# Patient Record
Sex: Female | Born: 1995 | Race: Black or African American | Hispanic: No | Marital: Married | Smoking: Never smoker
Health system: Southern US, Community
[De-identification: ages and names within clinical notes are randomized; demographics above are authoritative.]

## PROBLEM LIST (undated history)

## (undated) ENCOUNTER — Ambulatory Visit (HOSPITAL_COMMUNITY): Discharge status: Absent without leave | Payer: Medicaid Other

## (undated) DIAGNOSIS — D649 Anemia, unspecified: Secondary | ICD-10-CM

## (undated) DIAGNOSIS — S066XAA Traumatic subarachnoid hemorrhage with loss of consciousness status unknown, initial encounter: Secondary | ICD-10-CM

## (undated) DIAGNOSIS — S069XAA Unspecified intracranial injury with loss of consciousness status unknown, initial encounter: Secondary | ICD-10-CM

## (undated) DIAGNOSIS — R569 Unspecified convulsions: Secondary | ICD-10-CM

## (undated) DIAGNOSIS — F79 Unspecified intellectual disabilities: Secondary | ICD-10-CM

## (undated) HISTORY — DX: Unspecified intracranial injury with loss of consciousness status unknown, initial encounter: S06.9XAA

## (undated) HISTORY — DX: Traumatic subarachnoid hemorrhage with loss of consciousness status unknown, initial encounter: S06.6XAA

## (undated) HISTORY — DX: Anemia, unspecified: D64.9

## (undated) HISTORY — DX: Unspecified convulsions: R56.9

## (undated) HISTORY — DX: Unspecified intellectual disabilities: F79

## (undated) HISTORY — PX: NO PAST SURGERIES: SHX2092

---
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
## 2017-03-10 ENCOUNTER — Emergency Department (HOSPITAL_COMMUNITY)
Admission: EM | Admit: 2017-03-10 | Discharge: 2017-03-10 | Disposition: A | Discharge status: Home / Self Care | Payer: Medicaid Other | Attending: Emergency Medicine | Admitting: Emergency Medicine

## 2017-03-10 ENCOUNTER — Encounter (HOSPITAL_COMMUNITY): Payer: Self-pay | Admitting: Emergency Medicine

## 2017-03-10 DIAGNOSIS — S0100XA Unspecified open wound of scalp, initial encounter: Secondary | ICD-10-CM | POA: Diagnosis not present

## 2017-03-10 DIAGNOSIS — Y939 Activity, unspecified: Secondary | ICD-10-CM | POA: Diagnosis not present

## 2017-03-10 DIAGNOSIS — X58XXXA Exposure to other specified factors, initial encounter: Secondary | ICD-10-CM | POA: Insufficient documentation

## 2017-03-10 DIAGNOSIS — Y929 Unspecified place or not applicable: Secondary | ICD-10-CM | POA: Diagnosis not present

## 2017-03-10 DIAGNOSIS — Z79899 Other long term (current) drug therapy: Secondary | ICD-10-CM | POA: Diagnosis not present

## 2017-03-10 DIAGNOSIS — Y999 Unspecified external cause status: Secondary | ICD-10-CM | POA: Insufficient documentation

## 2017-03-10 MED ORDER — TRAMADOL HCL 50 MG PO TABS: 50.0000 mg | ORAL_TABLET | Freq: Four times a day (QID) | ORAL | 0 refills | Status: DC | PRN

## 2017-03-10 MED ORDER — CEPHALEXIN 500 MG PO CAPS: 500.0000 mg | ORAL_CAPSULE | Freq: Two times a day (BID) | ORAL | 0 refills | Status: DC

## 2017-03-10 NOTE — ED Triage Notes (Signed)
Patient here with complaints of head wound infection. Reports that she went to get her hair braided and braids were too tight. Pain 10/10. Speaks no [place], language is Swahili.

## 2017-03-10 NOTE — Discharge Instructions (Signed)
Clean with soap and water Take antibiotic for the next 5 days Follow up with primary care doctor

## 2017-03-10 NOTE — ED Provider Notes (Signed)
[facility]-EMERGENCY DEPT Provider Note   CSN: [id number] Arrival date & time: 03/10/17  1519     History   Chief Complaint Chief Complaint  Patient presents with  . Wound Infection    HPI Nancy [name] is a 21 y.o. female who presents with a scalp wound. No significant PMH. She states that she initially noticed the wound on the top of her scalp 3 weeks ago and it was small. She had her hair braided afterwards which made the wound worse. She has been keeping it clean and taking OTC pain medicine with minimal relief. She denies fever or drainage. She does not have a PCP.  HPI  Past Medical History:  Diagnosis Date  . Hearing loss     There are no active problems to display for this patient.   History reviewed. No pertinent surgical history.  OB History    No data available       Home Medications    Prior to Admission medications   Medication Sig Start Date End Date Taking? Authorizing Provider  ibuprofen (ADVIL,MOTRIN) 600 MG tablet Take 1 tablet (600 mg total) by mouth 3 (three) times daily. 07/13/16   Danelle Berry, PA-C  traMADol (ULTRAM) 50 MG tablet Take 1 tablet (50 mg total) by mouth every 12 (twelve) hours as needed for severe pain. 07/13/16   Danelle Berry, PA-C    Family History No family history on file.  Social History Social History  Substance Use Topics  . Smoking status: Never Smoker  . Smokeless tobacco: Never Used  . Alcohol use No     Allergies   Patient has no known allergies.   Review of Systems Review of Systems  Constitutional: Negative for chills and fever.  Skin: Positive for wound.  All other systems reviewed and are negative.    Physical Exam Updated Vital Signs BP 121/83 (BP Location: Right Arm)   Pulse 89   Temp 97.5 F (36.4 C) (Oral)   Resp 18   SpO2 100%   Physical Exam  Constitutional: She is oriented to person, place, and time. She appears well-developed and well-nourished. No distress.  HENT:  Head: Normocephalic  and atraumatic.  Eyes: Conjunctivae are normal. Pupils are equal, round, and reactive to light. Right eye exhibits no discharge. Left eye exhibits no discharge. No scleral icterus.  Neck: Normal range of motion.  Cardiovascular: Normal rate.   Pulmonary/Chest: Effort normal. No respiratory distress.  Abdominal: She exhibits no distension.  Neurological: She is alert and oriented to person, place, and time.  Wound noted on top of scalp which does not appear acutely infected. No redness or drainage noted. No tenderness  Skin: Skin is warm and dry.  Psychiatric: She has a normal mood and affect. Her behavior is normal.  Nursing note and vitals reviewed.      ED Treatments / Results  Labs (all labs ordered are listed, but only abnormal results are displayed) Labs Reviewed - No data to display  EKG  EKG Interpretation None       Radiology No results found.  Procedures Procedures (including critical care time)  Medications Ordered in ED Medications - No data to display   Initial Impression / Assessment and Plan / ED Course  I have reviewed the triage vital signs and the nursing notes.  Pertinent labs & imaging results that were available during my care of the patient were reviewed by me and considered in my medical decision making (see chart for details).  21 year  old female presents with scalp wound. Vitals are normal. Wet-to-dry dressing placed and pt will be given Keflex and Tramadol. Advised follow up with PCP. Return precautions given.  Final Clinical Impressions(s) / ED Diagnoses   Final diagnoses:  Open wound of scalp, unspecified open wound type, initial encounter    New Prescriptions New Prescriptions   No medications on file     Bethel Born, PA-C 03/10/17 1627    Lyndal Pulley, MD 03/11/17 [phone]

## 2017-03-30 ENCOUNTER — Encounter (HOSPITAL_COMMUNITY): Payer: Self-pay | Admitting: Emergency Medicine

## 2017-03-30 ENCOUNTER — Emergency Department (HOSPITAL_COMMUNITY): Payer: Medicaid Other

## 2017-03-30 ENCOUNTER — Other Ambulatory Visit: Payer: Self-pay

## 2017-03-30 ENCOUNTER — Observation Stay (HOSPITAL_COMMUNITY)
Admission: EM | Admit: 2017-03-30 | Discharge: 2017-03-31 | Disposition: A | Discharge status: Home / Self Care | Payer: Medicaid Other | Attending: Family Medicine | Admitting: Family Medicine

## 2017-03-30 DIAGNOSIS — D509 Iron deficiency anemia, unspecified: Secondary | ICD-10-CM | POA: Diagnosis not present

## 2017-03-30 DIAGNOSIS — B350 Tinea barbae and tinea capitis: Secondary | ICD-10-CM | POA: Diagnosis not present

## 2017-03-30 DIAGNOSIS — O98811 Other maternal infectious and parasitic diseases complicating pregnancy, first trimester: Secondary | ICD-10-CM | POA: Diagnosis not present

## 2017-03-30 DIAGNOSIS — Z3490 Encounter for supervision of normal pregnancy, unspecified, unspecified trimester: Secondary | ICD-10-CM

## 2017-03-30 DIAGNOSIS — Z3A11 11 weeks gestation of pregnancy: Secondary | ICD-10-CM | POA: Diagnosis not present

## 2017-03-30 DIAGNOSIS — R55 Syncope and collapse: Secondary | ICD-10-CM

## 2017-03-30 DIAGNOSIS — D649 Anemia, unspecified: Secondary | ICD-10-CM | POA: Diagnosis present

## 2017-03-30 DIAGNOSIS — Z3491 Encounter for supervision of normal pregnancy, unspecified, first trimester: Secondary | ICD-10-CM

## 2017-03-30 DIAGNOSIS — R42 Dizziness and giddiness: Secondary | ICD-10-CM | POA: Diagnosis present

## 2017-03-30 DIAGNOSIS — O99011 Anemia complicating pregnancy, first trimester: Secondary | ICD-10-CM | POA: Insufficient documentation

## 2017-03-30 LAB — URINALYSIS, ROUTINE W REFLEX MICROSCOPIC
BILIRUBIN URINE: NEGATIVE
Glucose, UA: NEGATIVE mg/dL
Hgb urine dipstick: NEGATIVE
KETONES UR: NEGATIVE mg/dL
LEUKOCYTES UA: NEGATIVE
Nitrite: NEGATIVE
Protein, ur: 30 mg/dL — AB
SPECIFIC GRAVITY, URINE: 1.028 (ref 1.005–1.030)
pH: 7 (ref 5.0–8.0)

## 2017-03-30 LAB — FOLATE: FOLATE: 12.6 ng/mL (ref >5.9)

## 2017-03-30 LAB — HEPATIC FUNCTION PANEL
ALT: 10 U/L — AB (ref 14–54)
AST: 18 U/L (ref 15–41)
Albumin: 4 g/dL (ref 3.5–5.0)
Alkaline Phosphatase: 52 U/L (ref 38–126)
BILIRUBIN TOTAL: 0.1 mg/dL — AB (ref 0.3–1.2)
Bilirubin, Direct: <0.1 mg/dL — ABNORMAL LOW (ref 0.1–0.5)
Total Protein: 7.7 g/dL (ref 6.5–8.1)

## 2017-03-30 LAB — POC OCCULT BLOOD, ED: Fecal Occult Bld: NEGATIVE

## 2017-03-30 LAB — RETICULOCYTES
RBC.: 3.96 MIL/uL (ref 3.87–5.11)
Retic Count, Absolute: 83.2 10*3/uL (ref 19.0–186.0)
Retic Ct Pct: 2.1 % (ref 0.4–3.1)

## 2017-03-30 LAB — IRON AND TIBC
IRON: 14 ug/dL — AB (ref 28–170)
SATURATION RATIOS: 3 % — AB (ref 10.4–31.8)
TIBC: 526 ug/dL — ABNORMAL HIGH (ref 250–450)
UIBC: 512 ug/dL

## 2017-03-30 LAB — BASIC METABOLIC PANEL
ANION GAP: 7 (ref 5–15)
BUN: 7 mg/dL (ref 6–20)
CO2: 20 mmol/L — AB (ref 22–32)
CREATININE: 0.33 mg/dL — AB (ref 0.44–1.00)
Calcium: 8.9 mg/dL (ref 8.9–10.3)
Chloride: 107 mmol/L (ref 101–111)
Glucose, Bld: 92 mg/dL (ref 65–99)
Potassium: 3.8 mmol/L (ref 3.5–5.1)
SODIUM: 134 mmol/L — AB (ref 135–145)

## 2017-03-30 LAB — CBC
HCT: 25.1 % — ABNORMAL LOW (ref 36.0–46.0)
HEMOGLOBIN: 7.5 g/dL — AB (ref 12.0–15.0)
MCH: 19.9 pg — AB (ref 26.0–34.0)
MCHC: 29.9 g/dL — ABNORMAL LOW (ref 30.0–36.0)
MCV: 66.8 fL — ABNORMAL LOW (ref 78.0–100.0)
Platelets: 145 10*3/uL — ABNORMAL LOW (ref 150–400)
RBC: 3.76 MIL/uL — AB (ref 3.87–5.11)
RDW: 18.9 % — ABNORMAL HIGH (ref 11.5–15.5)
WBC: 4.2 10*3/uL (ref 4.0–10.5)

## 2017-03-30 LAB — I-STAT BETA HCG BLOOD, ED (MC, WL, AP ONLY)

## 2017-03-30 LAB — PREPARE RBC (CROSSMATCH)

## 2017-03-30 LAB — CBG MONITORING, ED: GLUCOSE-CAPILLARY: 87 mg/dL (ref 65–99)

## 2017-03-30 LAB — FERRITIN: Ferritin: 4 ng/mL — ABNORMAL LOW (ref 11–307)

## 2017-03-30 LAB — VITAMIN B12: Vitamin B-12: 575 pg/mL (ref 180–914)

## 2017-03-30 MED ORDER — SODIUM CHLORIDE 0.9 % IV BOLUS (SEPSIS)
1000.0000 mL | Freq: Once | INTRAVENOUS | Status: AC
  Administered 2017-03-30: 1000 mL via INTRAVENOUS

## 2017-03-30 MED ORDER — ENOXAPARIN SODIUM 40 MG/0.4ML ~~LOC~~ SOLN: 40.0000 mg | SUBCUTANEOUS | Status: DC

## 2017-03-30 MED ORDER — SODIUM CHLORIDE 0.9 % IV SOLN: Freq: Once | INTRAVENOUS | Status: DC

## 2017-03-30 MED ORDER — ACETAMINOPHEN 650 MG RE SUPP: 650.0000 mg | Freq: Four times a day (QID) | RECTAL | Status: DC | PRN

## 2017-03-30 MED ORDER — ACETAMINOPHEN 325 MG PO TABS: 650.0000 mg | ORAL_TABLET | Freq: Four times a day (QID) | ORAL | Status: DC | PRN

## 2017-03-30 NOTE — ED Notes (Signed)
Attempted report 

## 2017-03-30 NOTE — ED Notes (Signed)
ED Provider at bedside using iPad interpreter

## 2017-03-30 NOTE — ED Triage Notes (Addendum)
Pt reports dizziness since Sunday and states she has been falling a lot because of it. Pt denies hitting her head when she fell. LMP was in February, pt is unsure if she may be pregnant. Swahili interpretor used.  Pt a/ox4, nad.

## 2017-03-30 NOTE — ED Notes (Signed)
[place] transported to [place]

## 2017-03-30 NOTE — ED Provider Notes (Signed)
[facility]-EMERGENCY DEPT Provider Note   CSN: [id number] Arrival date & time: 03/30/17  1300     History   Chief Complaint Chief Complaint  Patient presents with  . Dizziness    HPI Nancy [name] is a 21 y.o. female.  Patient is a 21 year old female with a history after reviewing medical records of meningitis with good response to antibiotics but chronic right arm weakness presenting today with a 5 day history of dizziness. Patient states with standing too long she becomes lightheaded and almost passes out. It has even caused her to fall. Because of cultural and language barriers it's unclear how often this is happening. She denies any abdominal pain, nausea or vomiting. No urinary complaints or vaginal bleeding.  LMP was in February. Patient denies ever being pregnant in the past.  She denies any history of anemia and has never received blood transfusion. When asked if she had fevers she states some days but not every day. She is not specific about the fever means to her. She denies any diarrhea. She is eating a normal diet. She denies any chest pain or shortness of breath. She currently has no PCP or medical care here in town. She currently takes no medications.   The history is provided by the patient. The history is limited by a language barrier. A language interpreter was used.  Dizziness  Quality:  Lightheadedness   Past Medical History:  Diagnosis Date  . Hearing loss     There are no active problems to display for this patient.   History reviewed. No pertinent surgical history.  OB History    No data available       Home Medications    Prior to Admission medications   Medication Sig Start Date End Date Taking? Authorizing Provider  cephALEXin (KEFLEX) 500 MG capsule Take 1 capsule (500 mg total) by mouth 2 (two) times daily. 03/10/17   Bethel Born, PA-C  ibuprofen (ADVIL,MOTRIN) 600 MG tablet Take 1 tablet (600 mg total) by mouth 3 (three) times daily.  07/13/16   Danelle Berry, PA-C  traMADol (ULTRAM) 50 MG tablet Take 1 tablet (50 mg total) by mouth every 6 (six) hours as needed. 03/10/17   Bethel Born, PA-C    Family History No family history on file.  Social History Social History  Substance Use Topics  . Smoking status: Never Smoker  . Smokeless tobacco: Never Used  . Alcohol use No     Allergies   Patient has no known allergies.   Review of Systems Review of Systems  HENT:       Wound on the top of her head that is not getting any better  Neurological: Positive for dizziness.  All other systems reviewed and are negative.    Physical Exam Updated Vital Signs BP 112/76   Pulse 69   Temp 99 F (37.2 C) (Oral)   Resp (!) 21   LMP 12/31/2016 (Approximate)   SpO2 100%   Physical Exam  Constitutional: She is oriented to person, place, and time. She appears well-developed and well-nourished. No distress.  HENT:  Head: Normocephalic and atraumatic.  Mouth/Throat: Oropharynx is clear and moist.  Alopecia, 3 x 2 cm wound with pointing consistent with a Kerion  Eyes: Conjunctivae and EOM are normal. Pupils are equal, round, and reactive to light.  Neck: Normal range of motion. Neck supple.  Cardiovascular: Normal rate, regular rhythm and intact distal pulses.   No murmur heard. Pulmonary/Chest: Effort normal and  breath sounds normal. No respiratory distress. She has no wheezes. She has no rales.  Abdominal: Soft. She exhibits no distension. There is no tenderness. There is no rebound and no guarding.    Musculoskeletal: Normal range of motion. She exhibits no edema or tenderness.  Neurological: She is alert and oriented to person, place, and time.  Skin: Skin is warm and dry. No rash noted. No erythema.  Psychiatric: She has a normal mood and affect. Her behavior is normal.  Nursing note and vitals reviewed.    ED Treatments / Results  Labs (all labs ordered are listed, but only abnormal results are  displayed) Labs Reviewed  BASIC METABOLIC PANEL - Abnormal; Notable for the following:       Result Value   Sodium 134 (*)    CO2 20 (*)    Creatinine, Ser 0.33 (*)    All other components within normal limits  CBC - Abnormal; Notable for the following:    RBC 3.76 (*)    Hemoglobin 7.5 (*)    HCT 25.1 (*)    MCV 66.8 (*)    MCH 19.9 (*)    MCHC 29.9 (*)    RDW 18.9 (*)    Platelets 145 (*)    All other components within normal limits  URINALYSIS, ROUTINE W REFLEX MICROSCOPIC - Abnormal; Notable for the following:    APPearance HAZY (*)    Protein, ur 30 (*)    Bacteria, UA MANY (*)    Squamous Epithelial / LPF 6-30 (*)    All other components within normal limits  IRON AND TIBC - Abnormal; Notable for the following:    Iron 14 (*)    TIBC 526 (*)    Saturation Ratios 3 (*)    All other components within normal limits  FERRITIN - Abnormal; Notable for the following:    Ferritin 4 (*)    All other components within normal limits  I-STAT BETA HCG BLOOD, ED ([facility], [facility], [facility] ONLY) - Abnormal; Notable for the following:    I-stat hCG, quantitative >2,000.0 (*)    All other components within normal limits  CULTURE, OB URINE  VITAMIN B12  FOLATE  RETICULOCYTES  HEPATIC FUNCTION PANEL  PATHOLOGIST SMEAR REVIEW  RPR  SICKLE CELL SCREEN  CBG MONITORING, ED  POC OCCULT BLOOD, ED  POC OCCULT BLOOD, ED  TYPE AND SCREEN  PREPARE RBC (CROSSMATCH)  ABO/RH    EKG  EKG Interpretation  Date/Time:  Friday Mar 30 2017 17:39:44 EDT Ventricular Rate:  75 PR Interval:    QRS Duration: 85 QT Interval:  362 QTC Calculation: 405 R Axis:   57 Text Interpretation:  Sinus rhythm RSR' in V1 or V2, probably normal variant No previous tracing Confirmed by Anitra Lauth  MD, Alphonzo Lemmings ([id number]) on 03/30/2017 6:00:14 PM       Radiology No results found.  Procedures Procedures (including critical care time)  Medications Ordered in ED Medications  sodium chloride 0.9 % bolus 1,000 mL (1,000 mLs  Intravenous New Bag/Given 03/30/17 1840)  0.9 %  sodium chloride infusion (not administered)     Initial Impression / Assessment and Plan / ED Course  I have reviewed the triage vital signs and the nursing notes.  Pertinent labs & imaging results that were available during my care of the patient were reviewed by me and considered in my medical decision making (see chart for details).     Patient is a 21 year old female presenting today newly diagnosed pregnancy at  approximately 12 weeks without obvious complication. Her initial complaint was feeling dizzy with near syncope. Patient found to have hemoglobin of 7.5 without any prior history. [place] 9 [place] ago. It is unclear whether she has had any blood work or medical exam since arriving here. She denies having heavy menses, blood in her stool or vomiting blood. She has felt tired, dizzy with standing and even has fallen because of her dizziness. She has not fully lost consciousness. She denies any urinary or vaginal complaints. Patient's anemia is unclear. She says occasionally she gets fevers but not every day. It is unclear even with the interpreter whether she is measuring the temperature she understands what fever means. Possibility for latent malaria.   Type and cross done and will also transfuse 2 units. Patient admitted for further care.  CRITICAL CARE Performed by: Gwyneth SproutPLUNKETT,Sanayah Munro Total critical care time: 30 minutes Critical care time was exclusive of separately billable procedures and treating other patients. Critical care was necessary to treat or prevent imminent or life-threatening deterioration. Critical care was time spent personally by me on the following activities: development of treatment plan with patient and/or surrogate as well as nursing, discussions with consultants, evaluation of patient's response to treatment, examination of patient, obtaining history from patient or surrogate,  ordering and performing treatments and interventions, ordering and review of laboratory studies, ordering and review of radiographic studies, pulse oximetry and re-evaluation of patient's condition.  EMERGENCY DEPARTMENT US PREGNANCY "Study: Limited Ultrasound of the Pelvis for Pregnancy"  INDICATIONS:Pregnancy(required) Multiple views of the uterus and pelvic cavity were obtained in real-time with a multi-frequency probe.  APPROACH:Transabdominal  PERFORMED BY: Myself IMAGES ARCHIVED?: Yes LIMITATIONS: none PREGNANCY FREE FLUID: None ADNEXAL FINDINGS:Left ovary not seen and Right ovary not seen GESTATIONAL AGE, ESTIMATE: 12w FETAL HEART RATE: 162 INTERPRETATION: Intrauterine gestational sac noted and Fetal heart activity seen     Final Clinical Impressions(s) / ED Diagnoses   Final diagnoses:  Anemia, unspecified type  First trimester pregnancy  Near syncope    New Prescriptions New Prescriptions   No medications on file     Gwyneth SproutPlunkett, Guerin Lashomb, MD 03/30/17 2024

## 2017-03-30 NOTE — ED Notes (Signed)
Pt sent I connect message re: wait for room

## 2017-03-30 NOTE — ED Notes (Addendum)
ED Provider at bedside using iPad interpreter to explain staying in the hospital and blood transfusion to pt.

## 2017-03-30 NOTE — H&P (Signed)
Family Medicine Teaching [facility] Admission History and Physical Service Pager: [phone]  Patient name: Dickie LaSalama Haymond Medical record number: [id number] Date of birth: Oct 26, 1996 Age: 21 y.o. Gender: female  Primary Care Provider: Patient, No Pcp Per Consultants: None  Code Status: Full   Chief Complaint: Dizziness   Assessment and Plan: Dickie LaSalama Wickware is a 21 y.o. female presenting with dizziness. No significant PMH.   Dizziness: Orthostatic, presyncopal episodes likely due to patient's anemia with a hemoglobin of 7.5 on this admission. Normal neurologic exam. Noted to have dry mucous membranes indicating that patient is probably somewhat dehydrated as well as she does not drink a lot of fluids. Vitals are stable. Patient received 2 units of blood in the ED and 1 L NS bolus. -Admit to MedSurg attending Dr. Lloyd HugerNeil -Vitals per floor -Obtain orthostatics -Repeat CBC and BMET in the AM  -Likely discharge tomorrow   Microcytic Anemia likely due to iron deficiency anemia, iron 14, ferritin 4. Folate and B12 are within normal limits. Mentzer index is 17.7 -taking iron deficiency anemia the more likely diagnosis and thalassemia less likely. RBC percent count not elevated indicating that bone marrow is likely not able to produce hemoglobin as needed due to lack of iron. Patient with a negative FOBT. Likely anemia from menstrual periods though patient denies any issues; some concern that this is due language/culture barrier as patient seem reticent to discuss medical care. - Received 2 units of blood in the ED  - Will need to start ferrous sulfate tomorrow  - Will check TSH for possibly heavy menstrual bleeding  - Will check PT/PTT   - Smear with review- given recently arrival in the [place]  Pregnancy: Unable to determine current fetal age as patient seems confused about exact dates of last LMP. - Will obtain ultrasound  - Will get OB urine culture  - Obtain RPR and HIV  -  Consult to case management regarding OB options for care, could likely be seen at our clinic   Kerion: Suppurative abscess likely from tinea capitis; does not look acutely infected. Per patient has had this on her head for the last 2 monthsl. Worsened when patient had braids in her hair. - Likely need systemic treatment with antifungals however given patient is pregnant will not treat - Consider outpatient referral to dermatology  FEN/GI:  Prophylaxis: Lovenox   Disposition: Observation   History of Present Illness:  Dickie LaSalama Rinks is a 21 y.o. female presenting with dizziness. The patient indicates that she is been having dizziness since Sunday. She states that she feels dizzy when going from sitting to standing position, she denies any chest pain, shortness of breath, palpitations associated with this. She also indicates that she has not been drinking as much water as she probably should. She denies any nausea or vomiting. She is currently pregnant. She states her last menstrual period was in January to me, however previously she stated her last menstrual period was in February to the ED provider. She denies any history of medical problems. She denies having any fevers or rigors. She denies having any history of malaria or ever being very ill. However looking back at her previous ED note, there are medical records that state that she has had meningitis in the remote past.  She denies having heavy menstrual periods, having to use several pads in a day or at a time. She denies having any blood in her stools or urine. She denies any history of having low blood  levels. She indicates that she does not have a physician that she sees for her medical care or for her OB care currently. No hx of sickle cell or other blood disorders in her family.  Patient does have a kerion noted at the top of her scalp, she said this has been there since April. She indicates it was associated with previous history of having  braids in her hair. She has not received any treatment for this issue.  Review Of Systems: Per HPI with the following additions:   Review of Systems  Constitutional: Negative for chills and fever.  HENT: Negative for congestion.   Respiratory: Negative for hemoptysis and shortness of breath.   Cardiovascular: Negative for chest pain and palpitations.  Gastrointestinal: Negative for abdominal pain, blood in stool, nausea and vomiting.  Genitourinary: Negative for dysuria.  Neurological: Positive for dizziness. Negative for focal weakness.    There are no active problems to display for this patient.   Past Medical History: Past Medical History:  Diagnosis Date  . Hearing loss     Past Surgical History: History reviewed. No pertinent surgical history.  Social History: Social History  Substance Use Topics  . Smoking status: Never Smoker  . Smokeless tobacco: Never Used  . Alcohol use No   Additional social history:  Please also refer to relevant sections of EMR.  Family History: No family history on file. Mother and father health. No hx of sickle cell   Allergies and Medications: No Known Allergies No current facility-administered medications on file prior to encounter.    Current Outpatient Prescriptions on File Prior to Encounter  Medication Sig Dispense Refill  . cephALEXin (KEFLEX) 500 MG capsule Take 1 capsule (500 mg total) by mouth 2 (two) times daily. 10 capsule 0  . ibuprofen (ADVIL,MOTRIN) 600 MG tablet Take 1 tablet (600 mg total) by mouth 3 (three) times daily. 30 tablet 0  . traMADol (ULTRAM) 50 MG tablet Take 1 tablet (50 mg total) by mouth every 6 (six) hours as needed. 15 tablet 0    Objective: BP 112/76   Pulse 69   Temp 99 F (37.2 C) (Oral)   Resp (!) 21   LMP 12/31/2016 (Approximate)   SpO2 100%  Exam: General: Very shy female, no acute distress,  Eyes: PERRLA  ENTM: Dry mucosa membranes  Neck: No lymphadenopathy  Cardiovascular: RRR, no  murmur, gallops or rubs Respiratory: CTAB, no wheezes or rhonchi  Gastrointestinal: BS+, no ttp, uterus below the umbilicus  MSK: No lower extremity edema,  Derm: Kerion noted on the forehead, with surrounding alopecia, not currently infected  Neuro:  Cn 2-7 intact Strength equal & normal in upper & lower extremities Psych: Psych:  Cognition and judgment appear intact.    Labs and Imaging: CBC BMET   Recent Labs Lab 03/30/17 1324  WBC 4.2  HGB 7.5*  HCT 25.1*  PLT 145*    Recent Labs Lab 03/30/17 1324  NA 134*  K 3.8  CL 107  CO2 20*  BUN 7  CREATININE 0.33*  GLUCOSE 92  CALCIUM 8.9     No results found.  Berton Bon, MD 03/30/2017, 7:31 PM PGY-2, [facility] FPTS Intern pager: [phone], text pages welcome

## 2017-03-31 DIAGNOSIS — D649 Anemia, unspecified: Secondary | ICD-10-CM

## 2017-03-31 DIAGNOSIS — Z3490 Encounter for supervision of normal pregnancy, unspecified, unspecified trimester: Secondary | ICD-10-CM

## 2017-03-31 LAB — TSH: TSH: 1.399 u[IU]/mL (ref 0.350–4.500)

## 2017-03-31 LAB — BASIC METABOLIC PANEL
Anion gap: 8 (ref 5–15)
BUN: 5 mg/dL — AB (ref 6–20)
CO2: 19 mmol/L — ABNORMAL LOW (ref 22–32)
Calcium: 9.2 mg/dL (ref 8.9–10.3)
Chloride: 107 mmol/L (ref 101–111)
Creatinine, Ser: 0.43 mg/dL — ABNORMAL LOW (ref 0.44–1.00)
GLUCOSE: 75 mg/dL (ref 65–99)
Potassium: 3.6 mmol/L (ref 3.5–5.1)
Sodium: 134 mmol/L — ABNORMAL LOW (ref 135–145)

## 2017-03-31 LAB — CBC
HCT: 31.8 % — ABNORMAL LOW (ref 36.0–46.0)
Hemoglobin: 10.4 g/dL — ABNORMAL LOW (ref 12.0–15.0)
MCH: 23.5 pg — ABNORMAL LOW (ref 26.0–34.0)
MCHC: 32.7 g/dL (ref 30.0–36.0)
MCV: 71.8 fL — ABNORMAL LOW (ref 78.0–100.0)
PLATELETS: 132 10*3/uL — AB (ref 150–400)
RBC: 4.43 MIL/uL (ref 3.87–5.11)
RDW: 20.9 % — AB (ref 11.5–15.5)
WBC: 6.5 10*3/uL (ref 4.0–10.5)

## 2017-03-31 LAB — PROTIME-INR
INR: 1.16
Prothrombin Time: 14.8 seconds (ref 11.4–15.2)

## 2017-03-31 LAB — APTT: APTT: 34 s (ref 24–36)

## 2017-03-31 MED ORDER — FERROUS SULFATE 325 (65 FE) MG PO TABS: 325.0000 mg | ORAL_TABLET | Freq: Every day | ORAL | 3 refills | Status: DC

## 2017-03-31 MED ORDER — PRENATAL MULTIVITAMIN CH
1.0000 | ORAL_TABLET | Freq: Every day | ORAL | Status: DC
  Administered 2017-03-31: 1 via ORAL
  Filled 2017-03-31: qty 1

## 2017-03-31 MED ORDER — PRENATAL MULTIVITAMIN CH: 1.0000 | ORAL_TABLET | Freq: Every day | ORAL | 1 refills | Status: DC

## 2017-03-31 MED ORDER — FERROUS SULFATE 325 (65 FE) MG PO TABS
325.0000 mg | ORAL_TABLET | Freq: Every day | ORAL | Status: DC
  Administered 2017-03-31: 325 mg via ORAL
  Filled 2017-03-31: qty 1

## 2017-03-31 MED ORDER — COMPLETENATE 29-1 MG PO CHEW
1.0000 | CHEWABLE_TABLET | Freq: Every day | ORAL | Status: DC
  Filled 2017-03-31: qty 1

## 2017-03-31 NOTE — Progress Notes (Signed)
Pt for discharge.  Used interpreter services for Swahili through [employer] system for all discharge instructions.  Reviewed meds (new and ones to stop), and follow up appt at the family medicine center on Monday, 5/14 at 2:00 pm.  Pt given copy of instructions.

## 2017-03-31 NOTE — Discharge Summary (Signed)
Family Medicine Teaching [facility] Discharge Summary  Patient name: Zoye Chandra Medical record number: [id number] Date of birth: [birth year] Age: 21 y.o. Gender: female Date of Admission: 03/30/2017  Date of Discharge: 03/31/2017 Admitting Physician: Nestor Ramp, MD  Primary Care Provider: Patient, No Pcp Per Consultants: None  Indication for Hospitalization: Symptomatic Anemia  Discharge Diagnoses/Problem List:  Symptomatic iron deficiency anemia, pregnancy, kerion  Disposition: Home  Discharge Condition: Improved.  Discharge Exam: See progress note for day of discharge.   Brief Hospital Course:  Patient is a 21 year old female who presented to the ED with 5 days of dizziness. Work up was significant for microcytic anemia (hgb 7.5, MCV 66.9, iron 14, ferritin 4)  and positive pregnancy test. She had no signs of active bleeding. Her blood loss was thought to be multifactorial in setting of menstrual periods, pregnancy, and nutritional deficits. Patient was admitted and received 2 units of p RBC. She had a transvaginal ultrasound performed which confirmed an IUP at 11weeks 2 days with estimated due date of 10/13/2017. Patient was started on oral iron and a prenatal vitamin at the time of discharge. Initial OB labs were drawn prior to discharge. She will be following up at the [facility] on 04/02/2017 and would like to have her prenatal care there as well.   Issues for Follow Up:  1. Follow up adherence to iron and prenatal vitamins 2. Follow up peripheral smear, TSH 3. Follow up initial OB labs (HIV, RPR, sickle cell screen, urine culture) 4. Schedule initial OB visit at the [facility]  Significant Procedures: None  Significant Labs and Imaging:   Recent Labs Lab 03/30/17 1324 03/31/17 1021  WBC 4.2 6.5  HGB 7.5* 10.4*  HCT 25.1* 31.8*  PLT 145* 132*    Recent Labs Lab 03/30/17 1324 03/30/17 1834 03/31/17 1021  NA 134*  --  134*  K 3.8  --  3.6  CL 107  --  107  CO2 20*   --  19*  GLUCOSE 92  --  75  BUN 7  --  5*  CREATININE 0.33*  --  0.43*  CALCIUM 8.9  --  9.2  ALKPHOS  --  52  --   AST  --  18  --   ALT  --  10*  --   ALBUMIN  --  4.0  --    Iron/TIBC/Ferritin/ %Sat    Component Value Date/Time   IRON 14 (L) 03/30/2017 1817   TIBC 526 (H) 03/30/2017 1817   FERRITIN 4 (L) 03/30/2017 1817   IRONPCTSAT 3 (L) 03/30/2017 1817     Results/Tests Pending at Time of Discharge: OB labs, peripheral smear, TSH  Discharge Medications:  Allergies as of 03/31/2017   No Known Allergies     Medication List    STOP taking these medications   cephALEXin 500 MG capsule Commonly known as:  KEFLEX   ibuprofen 600 MG tablet Commonly known as:  ADVIL,MOTRIN   traMADol 50 MG tablet Commonly known as:  ULTRAM     TAKE these medications   ferrous sulfate 325 (65 FE) MG tablet Take 1 tablet (325 mg total) by mouth daily with breakfast.   prenatal multivitamin Tabs tablet Take 1 tablet by mouth daily at 12 noon.      Discharge Instructions: Please refer to Patient Instructions section of EMR for full details.  Patient was counseled important signs and symptoms that should prompt return to medical care, changes in medications, dietary instructions, activity  restrictions, and follow up appointments.   Follow-Up Appointments: Follow-up Information    Ardith DarkParker, Caleb M, MD Follow up on 04/02/2017.   Specialty:  Family Medicine Why:  2:00pm Contact information: [address] [place] [address] [phone]           Ardith DarkParker, Caleb M, MD 04/02/2017, 8:46 AM PGY-3, [facility]

## 2017-03-31 NOTE — Progress Notes (Signed)
Family Medicine Teaching Service Daily Progress Note Intern Pager: [phone]  Patient name: Nancy [name] Perleberg Medical record number: [id number] Date of birth: 11-Apr-1996 Age: 21 y.o. Gender: female  Primary Care Provider: Patient, No Pcp Per Consultants: None Code Status: Full  Pt Overview and Major Events to Date:  5/12 - Admitted with symptomatic anemia  Assessment and Plan: Nancy [name] Fawaz is a 21 y.o. female presenting with dizziness. No significant PMH.   Symptomatic Iron Deficiency Anemia. iron 14, ferritin 4. Mentzer index is 17.7. Received 2U pRBC in ED. Iron deficiency likely due to nutritional deficits vs menstrual periods - Follow up AM CBC - Start oral iron  - Peripheral smear pending  First Trimester Pregnancy: Dating ultrasound on 01/28/2017 confirmed IUP at 11 weeks 2 days, estimated due date 10/13/2017 - Standard prenatal labs: Urine culture, RPR, HIV - Consult to case management regarding OB options for care, could likely be seen at our clinic   Kerion:  - Likely need systemic treatment with antifungals however given patient is pregnant will not treat - Consider outpatient referral to dermatology  FEN/GI:  Prophylaxis: Lovenox   Disposition: Placed in observation pending above management.   Subjective:  Doing well this morning. Ready to go home. Feels much better after transfusion.   Objective: Temp:  [98.1 F (36.7 C)-99.3 F (37.4 C)] 99 F (37.2 C) (05/12 0513) Pulse Rate:  [69-92] 75 (05/12 0513) Resp:  [14-23] 17 (05/12 0513) BP: (93-118)/(54-78) 110/78 (05/12 0513) SpO2:  [100 %] 100 % (05/12 0513) Weight:  [122 lb 9.6 oz (55.6 kg)] 122 lb 9.6 oz (55.6 kg) (05/11 2252) Physical Exam: General: 21yp F in NAD sitting on exam bed Cardiovascular: RRR, no murmurs Respiratory: CTA, NWOB Abdomen: S, NT, ND Extremities: WWP, no cyanosis, normal cap refill.   Laboratory:  Recent Labs Lab 03/30/17 1324  WBC 4.2  HGB 7.5*  HCT 25.1*  PLT 145*     Recent Labs Lab 03/30/17 1324 03/30/17 1834  NA 134*  --   K 3.8  --   CL 107  --   CO2 20*  --   BUN 7  --   CREATININE 0.33*  --   CALCIUM 8.9  --   PROT  --  7.7  BILITOT  --  0.1*  ALKPHOS  --  52  ALT  --  10*  AST  --  18  GLUCOSE 92  --    Imaging/Diagnostic Tests: None new.   Ardith DarkParker, Deanta Mincey M, MD 03/31/2017, 8:23 AM PGY-3, [place] [facility] [place] [facility] FPTS Intern pager: [phone], text pages welcome

## 2017-03-31 NOTE — Discharge Instructions (Signed)
First Trimester of Pregnancy The first trimester of pregnancy is from week 1 until the end of week 13 (months 1 through 3). During this time, your baby will begin to develop inside you. At 6-8 weeks, the eyes and face are formed, and the heartbeat can be seen on ultrasound. At the end of 12 weeks, all the baby's organs are formed. Prenatal care is all the medical care you receive before the birth of your baby. Make sure you get good prenatal care and follow all of your doctor's instructions. Follow these instructions at home: Medicines   Take over-the-counter and prescription medicines only as told by your doctor. Some medicines are safe and some medicines are not safe during pregnancy.  Take a prenatal vitamin that contains at least 600 micrograms (mcg) of folic acid.  If you have trouble pooping (constipation), take medicine that will make your stool soft (stool softener) if your doctor approves. Eating and drinking   Eat regular, healthy meals.  Your doctor will tell you the amount of weight gain that is right for you.  Avoid raw meat and uncooked cheese.  If you feel sick to your stomach (nauseous) or throw up (vomit):  Eat 4 or 5 small meals a day instead of 3 large meals.  Try eating a few soda crackers.  Drink liquids between meals instead of during meals.  To prevent constipation:  Eat foods that are high in fiber, like fresh fruits and vegetables, whole grains, and beans.  Drink enough fluids to keep your pee (urine) clear or pale yellow. Activity   Exercise only as told by your doctor. Stop exercising if you have cramps or pain in your lower belly (abdomen) or low back.  Do not exercise if it is too hot, too humid, or if you are in a place of great height (high altitude).  Try to avoid standing for long periods of time. Move your legs often if you must stand in one place for a long time.  Avoid heavy lifting.  Wear low-heeled shoes. Sit and stand up straight.  You  can have sex unless your doctor tells you not to. Relieving pain and discomfort   Wear a good support bra if your breasts are sore.  Take warm water baths (sitz baths) to soothe pain or discomfort caused by hemorrhoids. Use hemorrhoid cream if your doctor says it is okay.  Rest with your legs raised if you have leg cramps or low back pain.  If you have puffy, bulging veins (varicose veins) in your legs:  Wear support hose or compression stockings as told by your doctor.  Raise (elevate) your feet for 15 minutes, 3-4 times a day.  Limit salt in your food. Prenatal care   Schedule your prenatal visits by the twelfth week of pregnancy.  Write down your questions. Take them to your prenatal visits.  Keep all your prenatal visits as told by your doctor. This is important. Safety   Wear your seat belt at all times when driving.  Make a list of emergency phone numbers. The list should include numbers for family, friends, the hospital, and police and fire departments. General instructions   Ask your doctor for a referral to a local prenatal class. Begin classes no later than at the start of month 6 of your pregnancy.  Ask for help if you need counseling or if you need help with nutrition. Your doctor can give you advice or tell you where to go for help.  Do  not use hot tubs, steam rooms, or saunas.  Do not douche or use tampons or scented sanitary pads.  Do not cross your legs for long periods of time.  Avoid all herbs and alcohol. Avoid drugs that are not approved by your doctor.  Do not use any tobacco products, including cigarettes, chewing tobacco, and electronic cigarettes. If you need help quitting, ask your doctor. You may get counseling or other support to help you quit.  Avoid cat litter boxes and soil used by cats. These carry germs that can cause birth defects in the baby and can cause a loss of your baby (miscarriage) or stillbirth.  Visit your dentist. At home,  brush your teeth with a soft toothbrush. Be gentle when you floss. Contact a doctor if:  You are dizzy.  You have mild cramps or pressure in your lower belly.  You have a nagging pain in your belly area.  You continue to feel sick to your stomach, you throw up, or you have watery poop (diarrhea).  You have a bad smelling fluid coming from your vagina.  You have pain when you pee (urinate).  You have increased puffiness (swelling) in your face, hands, legs, or ankles. Get help right away if:  You have a fever.  You are leaking fluid from your vagina.  You have spotting or bleeding from your vagina.  You have very bad belly cramping or pain.  You gain or lose weight rapidly.  You throw up blood. It may look like coffee grounds.  You are around people who have [place] measles, fifth disease, or chickenpox.  You have a very bad headache.  You have shortness of breath.  You have any kind of trauma, such as from a fall or a car accident. Summary  The first trimester of pregnancy is from week 1 until the end of week 13 (months 1 through 3).  To take care of yourself and your unborn baby, you will need to eat healthy meals, take medicines only if your doctor tells you to do so, and do activities that are safe for you and your baby.  Keep all follow-up visits as told by your doctor. This is important as your doctor will have to ensure that your baby is healthy and growing well. This information is not intended to replace advice given to you by your health care provider. Make sure you discuss any questions you have with your health care provider. Document Released: 04/24/2008 Document Revised: 11/14/2016 Document Reviewed: 11/14/2016 Elsevier Interactive Patient Education  2017 [employer]. Iron Deficiency Anemia, Adult Iron-deficiency anemia is when you have a low amount of red blood cells or hemoglobin. This happens because you have too little iron in your body. Hemoglobin  carries oxygen to parts of the body. Anemia can cause your body to not get enough oxygen. It may or may not cause symptoms. Follow these instructions at home: Medicines   Take over-the-counter and prescription medicines only as told by your doctor. This includes iron pills (supplements) and vitamins.  If you cannot handle taking iron pills by mouth, ask your doctor about getting iron through:  A vein (intravenously).  A shot (injection) into a muscle.  Take iron pills when your stomach is empty. If you cannot handle this, take them with food.  Do not drink milk or take antacids at the same time as your iron pills.  To prevent trouble pooping (constipation), eat fiber or take medicine (stool softener) as told by your doctor.  Eating and drinking   Talk with your doctor before changing the foods you eat. He or she may tell you to eat foods that have a lot of iron, such as:  Liver.  Lowfat (lean) beef.  Breads and cereals that have iron added to them (fortified breads and cereals).  Eggs.  Dried fruit.  Dark green, leafy vegetables.  Drink enough fluid to keep your pee (urine) clear or pale yellow.  Eat fresh fruits and vegetables that are high in vitamin C. They help your body to use iron. Foods with a lot of vitamin C include:  Oranges.  Peppers.  Tomatoes.  Mangoes. General instructions   Return to your normal activities as told by your doctor. Ask your doctor what activities are safe for you.  Keep yourself clean, and keep things clean around you (your surroundings). Anemia can make you get sick more easily.  Keep all follow-up visits as told by your doctor. This is important. Contact a doctor if:  You feel sick to your stomach (nauseous).  You throw up (vomit).  You feel weak.  You are sweating for no clear reason.  You have trouble pooping, such as:  Pooping (having a bowel movement) less than 3 times a week.  Straining to poop.  Having poop that  is hard, dry, or larger than normal.  Feeling full or bloated.  Pain in the lower belly.  Not feeling better after pooping. Get help right away if:  You pass out (faint). If this happens, do not drive yourself to the hospital. Call your local emergency services (911 in the [place]  You have chest pain.  You have shortness of breath that:  Is very bad.  Gets worse with physical activity.  You have a fast heartbeat.  You get light-headed when getting up from sitting or lying down. This information is not intended to replace advice given to you by your health care provider. Make sure you discuss any questions you have with your health care provider. Document Released: 12/09/2010 Document Revised: 07/26/2016 Document Reviewed: 07/26/2016 Elsevier Interactive Patient Education  2017 [employer]

## 2017-04-01 LAB — TYPE AND SCREEN
ABO/RH(D): O POS
Antibody Screen: NEGATIVE
Unit division: 0
Unit division: 0

## 2017-04-01 LAB — BPAM RBC
BLOOD PRODUCT EXPIRATION DATE: 201806072359
Blood Product Expiration Date: 201806072359
ISSUE DATE / TIME: 201805120124
ISSUE DATE / TIME: 201805112151
UNIT TYPE AND RH: 5100
Unit Type and Rh: 5100

## 2017-04-01 LAB — CULTURE, OB URINE: Culture: NO GROWTH

## 2017-04-01 LAB — RPR: RPR Ser Ql: NONREACTIVE

## 2017-04-01 LAB — HIV ANTIBODY (ROUTINE TESTING W REFLEX): HIV Screen 4th Generation wRfx: NONREACTIVE

## 2017-04-02 ENCOUNTER — Encounter: Payer: Self-pay | Admitting: Family Medicine

## 2017-04-02 ENCOUNTER — Ambulatory Visit (INDEPENDENT_AMBULATORY_CARE_PROVIDER_SITE_OTHER): Payer: Medicaid Other | Admitting: Family Medicine

## 2017-04-02 DIAGNOSIS — D509 Iron deficiency anemia, unspecified: Secondary | ICD-10-CM | POA: Diagnosis present

## 2017-04-02 DIAGNOSIS — Z3491 Encounter for supervision of normal pregnancy, unspecified, first trimester: Secondary | ICD-10-CM

## 2017-04-02 DIAGNOSIS — Z3490 Encounter for supervision of normal pregnancy, unspecified, unspecified trimester: Secondary | ICD-10-CM | POA: Diagnosis not present

## 2017-04-02 LAB — ABO/RH: ABO/RH(D): O POS

## 2017-04-02 LAB — SICKLE CELL SCREEN: Sickle Cell Screen: NEGATIVE

## 2017-04-02 NOTE — Assessment & Plan Note (Signed)
Sickle cell screening test still pending, otherwise prenatal labs are normal. Informed patient of this this morning. Advised patient to follow up soon for her initial OB visit.

## 2017-04-02 NOTE — Assessment & Plan Note (Signed)
Doing well with oral iron supplementation. Will continue. Would consider rechecking labs in 2-3 weeks to assess for response.

## 2017-04-02 NOTE — Progress Notes (Signed)
    Subjective:  Nancy [name] is a 21 y.o. female who [facility] today with a chief complaint of hospital follow up for iron deficiency anemia. History provided by Swahili interpretor.   HPI:  Iron Deficiency Anemia Patient admitted to the hospital for 1 day last week with symptomatic iron deficiency anemia. Her initial labs included: hgb 7.5, MCV 66.9, iron 14, ferritin 4 She received 2 units of pRBC and her hgb improved to 10.4. Her symptoms improved and she was discharged on oral iron. Since then, she has done well. She has been tolerating the oral iron well.  Pregnancy Patient was also found to be pregnant with and estimated due date of 10/13/2017 while admitted. She was started on prenatal vitamins at the time of discharge. No vaginal bleeding. No LOF.   ROS: Per HPI, otherwise all systems reviewed and are negative  PMH:  The following were reviewed and entered/updated in epic: Past Medical History:  Diagnosis Date  . Hearing loss    Patient Active Problem List   Diagnosis Date Noted  . First trimester pregnancy   . Anemia 03/30/2017   No past surgical history on file.  No family history on file.  Medications- reviewed and updated Current Outpatient Prescriptions  Medication Sig Dispense Refill  . ferrous sulfate 325 (65 FE) MG tablet Take 1 tablet (325 mg total) by mouth daily with breakfast. 30 tablet 3  . Prenatal Vit-Fe Fumarate-FA (PRENATAL MULTIVITAMIN) TABS tablet Take 1 tablet by mouth daily at 12 noon. 30 tablet 1   No current facility-administered medications for this visit.    Allergies-reviewed and updated No Known Allergies  Social History   Social History  . Marital status: Married    Spouse name: N/A  . Number of children: N/A  . Years of education: N/A   Social History Main Topics  . Smoking status: Never Smoker  . Smokeless tobacco: Never Used  . Alcohol use No  . Drug use: No  . Sexual activity: Not Asked   Other Topics  Concern  . None   Social History Narrative  . None    Objective:  Physical Exam: BP (!) 98/58   Pulse 87   Temp 98.7 F (37.1 C) (Oral)   Wt 121 lb (54.9 kg)   LMP 11/20/2016 (Approximate)   SpO2 98%   BMI 27.13 kg/m   Gen: NAD, resting comfortably CV: RRR with no murmurs appreciated Pulm: NWOB, CTAB with no crackles, wheezes, or rhonchi GI: Normal bowel sounds present. Soft, Nontender, Nondistended. MSK: no edema, cyanosis, or clubbing noted Skin: warm, dry Neuro: grossly normal, moves all extremities Psych: Normal affect and thought content  Assessment/Plan:  Anemia Doing well with oral iron supplementation. Will continue. Would consider rechecking labs in 2-3 weeks to assess for response.   First trimester pregnancy Sickle cell screening test still pending, otherwise prenatal labs are normal. Informed patient of this this morning. Advised patient to follow up soon for her initial OB visit.    Katina Degreealeb M. Jimmey RalphParker, MD [facility] Resident PGY-3 04/02/2017 2:34 PM

## 2017-04-02 NOTE — Patient Instructions (Signed)
Schedule an appointment up front for your initial OB visit.  This visit will take 60 minutes.  We will recheck blood work in 2-3 weeks.  Take care,  Dr Jimmey RalphParker

## 2017-04-09 ENCOUNTER — Other Ambulatory Visit: Payer: Self-pay | Admitting: Family Medicine

## 2017-04-09 ENCOUNTER — Other Ambulatory Visit (INDEPENDENT_AMBULATORY_CARE_PROVIDER_SITE_OTHER): Payer: Self-pay

## 2017-04-09 DIAGNOSIS — Z3481 Encounter for supervision of other normal pregnancy, first trimester: Secondary | ICD-10-CM

## 2017-04-09 LAB — POCT URINALYSIS DIP (MANUAL ENTRY)
BILIRUBIN UA: NEGATIVE
BILIRUBIN UA: NEGATIVE mg/dL
GLUCOSE UA: NEGATIVE mg/dL
Leukocytes, UA: NEGATIVE
NITRITE UA: NEGATIVE
Protein Ur, POC: NEGATIVE mg/dL
RBC UA: NEGATIVE
Spec Grav, UA: 1.02 (ref 1.010–1.025)
Urobilinogen, UA: 0.2 E.U./dL
pH, UA: 7.5 (ref 5.0–8.0)

## 2017-04-10 LAB — OBSTETRIC PANEL, INCLUDING HIV
Antibody Screen: NEGATIVE
BASOS: 0 %
Basophils Absolute: 0 10*3/uL (ref 0.0–0.2)
EOS (ABSOLUTE): 0.1 10*3/uL (ref 0.0–0.4)
EOS: 1 %
HEMATOCRIT: 32.1 % — AB (ref 34.0–46.6)
HEMOGLOBIN: 10.3 g/dL — AB (ref 11.1–15.9)
HEP B S AG: NEGATIVE
HIV SCREEN 4TH GENERATION: NONREACTIVE
IMMATURE GRANULOCYTES: 0 %
Immature Grans (Abs): 0 10*3/uL (ref 0.0–0.1)
LYMPHS ABS: 1 10*3/uL (ref 0.7–3.1)
Lymphs: 17 %
MCH: 23.4 pg — AB (ref 26.6–33.0)
MCHC: 32.1 g/dL (ref 31.5–35.7)
MCV: 73 fL — ABNORMAL LOW (ref 79–97)
MONOS ABS: 0.4 10*3/uL (ref 0.1–0.9)
Monocytes: 7 %
NEUTROS PCT: 75 %
Neutrophils Absolute: 4.3 10*3/uL (ref 1.4–7.0)
Platelets: 132 10*3/uL — ABNORMAL LOW (ref 150–379)
RBC: 4.41 x10E6/uL (ref 3.77–5.28)
RDW: 23 % — ABNORMAL HIGH (ref 12.3–15.4)
RH TYPE: POSITIVE
RPR: NONREACTIVE
Rubella Antibodies, IGG: 6.53 index (ref >0.99)
WBC: 5.7 10*3/uL (ref 3.4–10.8)

## 2017-04-11 ENCOUNTER — Telehealth: Payer: Self-pay | Admitting: *Deleted

## 2017-04-11 LAB — CULTURE, OB URINE

## 2017-04-11 LAB — URINE CULTURE, OB REFLEX

## 2017-04-11 NOTE — Telephone Encounter (Signed)
Patient presented to office today for appt at 1:30 pm with Dr. Jimmey RalphParker although we were closed for half-day in-service.  No record of appt in EMR and unsure who gave patient card indicating she had an appt today in our office.  Patient spoke with Annice [facility]ie [facility] new patient coordinator) and patient needs appt for NOB.  Appt scheduled with Dr. Artist PaisYoo for 04/18/17 at 9:30 am.  Provided patient with [facility] voucher for [place].  Altamese Dilling~Letzy Gullickson, BSN, RN-BC   [facility] Indigent Fund Voucher:  Previous assistance?  no  Taxi, Bus pass, gas card, food, or other? Taxi Med co-pay? no         [facility] Indigent Fund?  no Other:  no  Provider:  [facility] Staff:  Altamese Dilling~Igor Bishop, BSN, RN-BC  Note routed to Altamese DillingJeannette Lakyra Tippins, RN and Sammuel Hineseborah Moore, LCSW to add to [employer] spreadsheet.

## 2017-04-12 LAB — CULTURE, OB URINE

## 2017-04-17 NOTE — Progress Notes (Signed)
Nancy [name] is a 21 y.o. yo G1P0 at 5445w0d who presents for her initial prenatal visit. Pregnancy is not planned She reports no concerns. She  is not taking PNV. See flow sheet for details.  PMH, POBH, FH, meds, allergies and Social Hx reviewed.  Prenatal Exam: Gen: Well nourished, well developed.  No distress.  Vitals noted. HEENT: Normocephalic, atraumatic.  Neck supple without cervical lymphadenopathy, thyromegaly or thyroid nodules.  Fair dentition. CV: RRR no murmur, gallops or rubs Lungs: CTAB.  Normal respiratory effort without wheezes or rales. Abd: soft, NTND. +BS.  Uterus not appreciated above pelvis. GU: Normal external female genitalia without lesions.  Normal vaginal, well rugated without lesions. No vaginal discharge.  Bimanual exam: No adnexal mass or TTP. No CMT.  Uterus size palpated below the umbilicus Ext: No clubbing, cyanosis or edema. Psych: Normal grooming and dress.  Not depressed or anxious appearing.  Normal thought content and process without flight of ideas or looseness of associations.  Assessment & Plan: 1) 21 y.o. yo G1P0 at 2945w0d via early ultrasound doing well.  Current pregnancy issues include anemia and poor nutrition. Counseled on eating varied diet, meats, drinking milk, dark green leafy vegetables, taking prenatal vitamins. Checking CBC today. Dating is reliable. Prenatal labs reviewed, notable for anemia 2/2 limited nutrition, patient dislikes meats and eats a limited diet. Genetic screening offered: would like quad screen at next visit. Early glucola is not indicated.  PHQ-9 (scored 1) and Pregnancy Medical Home forms completed and reviewed.  Bleeding and pain precautions reviewed. Importance of prenatal vitamins reviewed.  Follow up in 4 weeks.  Leland HerElsia J Yoo, DO PGY-1,  Family Medicine 04/18/2017 11:59 AM

## 2017-04-18 ENCOUNTER — Ambulatory Visit (INDEPENDENT_AMBULATORY_CARE_PROVIDER_SITE_OTHER): Payer: Self-pay | Admitting: Family Medicine

## 2017-04-18 ENCOUNTER — Other Ambulatory Visit (HOSPITAL_COMMUNITY)
Admission: RE | Admit: 2017-04-18 | Discharge: 2017-04-18 | Disposition: A | Discharge status: Home / Self Care | Payer: Medicaid Other | Source: Ambulatory Visit | Attending: Family Medicine | Admitting: Family Medicine

## 2017-04-18 ENCOUNTER — Encounter: Payer: Self-pay | Admitting: Family Medicine

## 2017-04-18 VITALS — BP 106/62 | HR 71 | Temp 98.3°F | Wt 120.8 lb

## 2017-04-18 DIAGNOSIS — Z3A14 14 weeks gestation of pregnancy: Secondary | ICD-10-CM

## 2017-04-18 DIAGNOSIS — D509 Iron deficiency anemia, unspecified: Secondary | ICD-10-CM

## 2017-04-18 MED ORDER — PRENATAL MULTIVITAMIN CH: 1.0000 | ORAL_TABLET | Freq: Every day | ORAL | 3 refills | Status: DC

## 2017-04-18 MED ORDER — FERROUS SULFATE 325 (65 FE) MG PO TABS: 325.0000 mg | ORAL_TABLET | Freq: Every day | ORAL | 3 refills | Status: DC

## 2017-04-18 NOTE — Patient Instructions (Signed)
It was good to meet you today,  For your pregnancy - Start taking prenatal vitamins. I have prescribed some but they may not be covered. Your other option would be to try the health department ([address] [place], [place], [place] [id number]. [phone]) -  Eat a varied diet and take iron supplements  We are checking some labs today, and someone will call you or send you a letter with the results when they are available.   Take care and seek immediate care sooner if you develop any concerns.   Dr. Leland HerElsia J Joannie Medine, DO [facility]

## 2017-04-19 ENCOUNTER — Encounter: Payer: Self-pay | Admitting: Family Medicine

## 2017-04-19 LAB — CBC
Hematocrit: 31.7 % — ABNORMAL LOW (ref 34.0–46.6)
Hemoglobin: 10.2 g/dL — ABNORMAL LOW (ref 11.1–15.9)
MCH: 24.3 pg — ABNORMAL LOW (ref 26.6–33.0)
MCHC: 32.2 g/dL (ref 31.5–35.7)
MCV: 76 fL — AB (ref 79–97)
PLATELETS: 179 10*3/uL (ref 150–379)
RBC: 4.2 x10E6/uL (ref 3.77–5.28)
RDW: 23.7 % — AB (ref 12.3–15.4)
WBC: 6.4 10*3/uL (ref 3.4–10.8)

## 2017-04-19 LAB — CERVICOVAGINAL ANCILLARY ONLY
CHLAMYDIA, DNA PROBE: NEGATIVE
NEISSERIA GONORRHEA: NEGATIVE
TRICH (WINDOWPATH): NEGATIVE

## 2017-04-27 ENCOUNTER — Encounter: Payer: Self-pay | Admitting: Licensed Clinical Social Worker

## 2017-04-27 ENCOUNTER — Ambulatory Visit (INDEPENDENT_AMBULATORY_CARE_PROVIDER_SITE_OTHER): Payer: Medicaid Other | Admitting: Internal Medicine

## 2017-04-27 VITALS — BP 110/54 | HR 89 | Temp 98.3°F | Ht <= 58 in | Wt 122.0 lb

## 2017-04-27 DIAGNOSIS — Z3A14 14 weeks gestation of pregnancy: Secondary | ICD-10-CM

## 2017-04-27 DIAGNOSIS — D509 Iron deficiency anemia, unspecified: Secondary | ICD-10-CM | POA: Diagnosis present

## 2017-04-27 MED ORDER — DOCUSATE SODIUM 100 MG PO CAPS: 100.0000 mg | ORAL_CAPSULE | Freq: Two times a day (BID) | ORAL | 0 refills | Status: DC

## 2017-04-27 NOTE — Assessment & Plan Note (Signed)
No symptoms of anemia  - Continue prenatal vitamin with iron daily  - Provided colace as needed if patient develops any issues with constipation  - Discussed eating leafy greens  - CBC today

## 2017-04-27 NOTE — Patient Instructions (Addendum)
Please continue taking your prenatal/iron. If you become constipated you pick up the medication from the pharmacy

## 2017-04-27 NOTE — Progress Notes (Deleted)
   [facility] Noralee CharsAsiyah Anevay Campanella, MD Phone: [phone]  Reason For Visit:   # *** -   Past Medical History Reviewed problem list.  Medications- reviewed and updated No additions to family history Social history- patient is a *** smoker  Objective: BP (!) 110/54   Pulse 89   Temp 98.3 F (36.8 C) (Oral)   Ht 4\' 8"  (1.422 m)   Wt 122 lb (55.3 kg)   LMP 12/08/2016 (Approximate)   BMI 27.35 kg/m  Gen: NAD, alert, cooperative with exam HEENT: Normal    Neck: No masses palpated. No lymphadenopathy    Ears: Tympanic membranes intact, normal light reflex, no erythema, no bulging    Eyes: PERRLA, EOMI    Nose: nasal turbinates moist    Throat: moist mucus membranes, no erythema Cardio: regular rate and rhythm, S1S2 heard, no murmurs appreciated Pulm: clear to auscultation bilaterally, no wheezes, rhonchi or rales GI: soft, non-tender, non-distended, bowel sounds present, no hepatomegaly, no splenomegaly GU: external vaginal tissue ***, cervix ***, *** punctate lesions on cervix appreciated, *** discharge from cervical os, *** bleeding, *** cervical motion tenderness, *** abdominal/ adnexal masses Extremities: warm, well perfused, No edema, cyanosis or clubbing;  MSK: Normal gait and station Skin: dry, intact, no rashes or lesions Neuro: Strength and sensation grossly intact   Assessment/Plan: See problem based a/p  No problem-specific Assessment & Plan notes found for this encounter.

## 2017-04-27 NOTE — Progress Notes (Signed)
   [name] Kerrin Mo, MD Phone: [phone]  Reason For Visit: F/U Anemia   # Anemia  - Denies any dizziness, palpations, no SOB  - Take prenatal + iron combination pill, give to her by the pharmacy - No stomach issues, no abdominal pain, no contsipation  -  Sickle cell test negative    Past Medical History Reviewed problem list.  Medications- reviewed and updated No additions to family history Social history- patient is a smoker  Objective: BP (!) 110/54   Pulse 89   Temp 98.3 F (36.8 C) (Oral)   Ht '4\' 8"'$  (1.422 m)   Wt 122 lb (55.3 kg)   LMP 12/08/2016 (Approximate)   BMI 27.35 kg/m  Gen: NAD, alert, cooperative with exam Cardio: regular rate and rhythm, S1S2 heard, no murmurs appreciated Pulm: clear to auscultation bilaterally, no wheezes, rhonchi or rales Skin: dry, intact, no rashes or lesions   Assessment/Plan: See problem based a/p  Anemia No symptoms of anemia  - Continue prenatal vitamin with iron daily  - Provided colace as needed if patient develops any issues with constipation  - Discussed eating leafy greens  - CBC today    Pregnancy Could benefit from resources with upcoming pregnancy, likely limited resources. Significant language barriers  - Neoma Laming met with patient for medicaid discussion  - Will keep her on the list to evaluate for resources at future prenatal vistis

## 2017-04-27 NOTE — Assessment & Plan Note (Signed)
Could benefit from resources with upcoming pregnancy, likely limited resources. Significant language barriers  - Nancy [name] met with patient for medicaid discussion  - Will keep her on the list to evaluate for resources at future prenatal vistis

## 2017-04-27 NOTE — Progress Notes (Signed)
  Total time:15 minutes Type of Service: Integrated Behavioral Health- Individual Interpretor:Yes.    Interpreter Name Greggory StallionGeorge [phone]#[id number] and Language:Swahili   SUBJECTIVE: Nancy [name] is a 21 y.o. female  Patient was referred by Dr. Cathlean CowerMikell for: resources.  Patient reports the followingconcerns: needs information about her Medicaid for pregnant women and Medicaid Transportation.   ASSESSMENT:.Patient recently approved for [employer] Women's Medicaid effective May 1st.  LCSW reviewed Medicaid and Medicaid Transportation services and provided phone number to call DSS Medicaid Transporation for bus passes to her appointments. Intervention: Link to [employer], PLAN: LCSW will f/u with patient during next office visit for ongoing assessment of needs.  Sammuel Hineseborah Stan Cantave, LCSW Licensed Clinical Social Worker [facility]   [phone] 2:41 PM

## 2017-04-28 LAB — CBC
HEMOGLOBIN: 10 g/dL — AB (ref 11.1–15.9)
Hematocrit: 31 % — ABNORMAL LOW (ref 34.0–46.6)
MCH: 25.3 pg — ABNORMAL LOW (ref 26.6–33.0)
MCHC: 32.3 g/dL (ref 31.5–35.7)
MCV: 78 fL — ABNORMAL LOW (ref 79–97)
PLATELETS: 153 10*3/uL (ref 150–379)
RBC: 3.96 x10E6/uL (ref 3.77–5.28)
RDW: 24.6 % — ABNORMAL HIGH (ref 12.3–15.4)
WBC: 6.2 10*3/uL (ref 3.4–10.8)

## 2017-05-21 ENCOUNTER — Ambulatory Visit (INDEPENDENT_AMBULATORY_CARE_PROVIDER_SITE_OTHER): Payer: Medicaid Other | Admitting: Family Medicine

## 2017-05-21 VITALS — BP 100/60 | HR 82 | Temp 98.4°F | Wt 123.0 lb

## 2017-05-21 DIAGNOSIS — Z3A18 18 weeks gestation of pregnancy: Secondary | ICD-10-CM

## 2017-05-21 NOTE — Addendum Note (Signed)
Addended by: Leland HerYOO, Lillianne Eick J on: 05/21/2017 04:02 PM   Modules accepted: Orders

## 2017-05-21 NOTE — Addendum Note (Signed)
Addended by: Jone BasemanFLEEGER, JESSICA D on: 05/21/2017 04:55 PM   Modules accepted: Orders

## 2017-05-21 NOTE — Progress Notes (Signed)
Nancy [name] is a 21 y.o. G1P0 at 3769w5d here for routine follow up.  She reports no complaints. See flow sheet for details.  A/P: Pregnancy at 6069w5d.  Doing well.   Pregnancy issues include anemia and poor nutrition that is improving with a more varied diet. Anatomy ultrasound ordered to be scheduled at 18-19 weeks. Patient is interested in genetic screening, quad screen ordered today Bleeding and pain precautions reviewed. Follow up 4 weeks.  Leland HerElsia J Leo Fray, DO PGY-2, [facility] 05/21/2017 3:15 PM

## 2017-05-21 NOTE — Patient Instructions (Addendum)
It was great to see you again!  For your pregnancy, - Keep taking your prenatal vitamins and eating a varied diet - We will schedule you  For your anatomy ultrasound.  We are checking some labs today, and someone will call you or send you a letter with the results when they are available. Please give us a call if you do not hear from us after 2 weeks.  Take care,  Dr. Leland HerElsia J Ephrata Verville, DO [facility]

## 2017-05-26 LAB — AFP TETRA
DIA MOM VALUE: 0.64
DIA VALUE (EIA): 123.36 pg/mL
DSR (By Age)    1 IN: 1167
DSR (Second Trimester) 1 IN: 10000
GESTATIONAL AGE AFP: 18.5 wk
MSAFP Mom: 1.02
MSAFP: 58.6 ng/mL
MSHCG MOM: 1.32
MSHCG: 39331 m[IU]/mL
Maternal Age At EDD: 19.7 yr
Osb Risk: 10000
Test Results:: NEGATIVE
Weight: 123 [lb_av]
uE3 Mom: 1.02
uE3 Value: 1.53 ng/mL

## 2017-05-28 ENCOUNTER — Encounter: Payer: Self-pay | Admitting: Family Medicine

## 2017-05-28 ENCOUNTER — Other Ambulatory Visit: Payer: Self-pay | Admitting: Family Medicine

## 2017-05-28 ENCOUNTER — Ambulatory Visit (HOSPITAL_COMMUNITY)
Admission: RE | Admit: 2017-05-28 | Discharge: 2017-05-28 | Disposition: A | Discharge status: Home / Self Care | Payer: Medicaid Other | Source: Ambulatory Visit | Attending: Family Medicine | Admitting: Family Medicine

## 2017-05-28 DIAGNOSIS — Z363 Encounter for antenatal screening for malformations: Secondary | ICD-10-CM | POA: Diagnosis present

## 2017-05-28 DIAGNOSIS — Z3A19 19 weeks gestation of pregnancy: Secondary | ICD-10-CM

## 2017-05-28 DIAGNOSIS — Z3A18 18 weeks gestation of pregnancy: Secondary | ICD-10-CM

## 2017-05-29 ENCOUNTER — Telehealth: Payer: Self-pay | Admitting: Family Medicine

## 2017-05-29 DIAGNOSIS — Z3490 Encounter for supervision of normal pregnancy, unspecified, unspecified trimester: Secondary | ICD-10-CM

## 2017-05-29 NOTE — Telephone Encounter (Addendum)
Called patient via phone Swahili interpretor [phone]. Discussed normal anatomy scan but was incomplete so will need to be scheduled for repeat ultrasound in 4-6 weeks. Patient voiced good understanding. Order placed for repeat ultrasound.

## 2017-05-29 NOTE — Telephone Encounter (Signed)
Using Swahili [phone] pt was contacted and informed of scheduled US, 8/14 at 10:45am. Pt voiced understanding.

## 2017-07-03 ENCOUNTER — Ambulatory Visit (HOSPITAL_COMMUNITY)
Admission: RE | Admit: 2017-07-03 | Discharge: 2017-07-03 | Disposition: A | Discharge status: Home / Self Care | Payer: Medicaid Other | Source: Ambulatory Visit | Attending: Family Medicine | Admitting: Family Medicine

## 2017-07-03 DIAGNOSIS — Z3482 Encounter for supervision of other normal pregnancy, second trimester: Secondary | ICD-10-CM | POA: Diagnosis not present

## 2017-07-03 DIAGNOSIS — Z3A24 24 weeks gestation of pregnancy: Secondary | ICD-10-CM | POA: Insufficient documentation

## 2017-07-03 DIAGNOSIS — Z3490 Encounter for supervision of normal pregnancy, unspecified, unspecified trimester: Secondary | ICD-10-CM

## 2017-07-03 DIAGNOSIS — Z362 Encounter for other antenatal screening follow-up: Secondary | ICD-10-CM | POA: Diagnosis present

## 2017-07-04 ENCOUNTER — Encounter: Payer: Self-pay | Admitting: Family Medicine

## 2017-07-04 ENCOUNTER — Telehealth: Payer: Self-pay

## 2017-07-04 NOTE — Telephone Encounter (Signed)
Attempted to contact pt to inform of normal US. Pt did not answer, so a VM was left to return call.

## 2017-07-04 NOTE — Telephone Encounter (Signed)
-----   Message from Leland HerElsia J Yoo, DO sent at 07/04/2017 10:54 AM EDT ----- Please call patient and let her know that anatomy scan normal. Thanks

## 2017-09-25 ENCOUNTER — Encounter: Payer: Self-pay | Admitting: Family Medicine

## 2017-09-25 DIAGNOSIS — O0930 Supervision of pregnancy with insufficient antenatal care, unspecified trimester: Secondary | ICD-10-CM | POA: Insufficient documentation

## 2017-09-26 ENCOUNTER — Other Ambulatory Visit (HOSPITAL_COMMUNITY)
Admission: RE | Admit: 2017-09-26 | Discharge: 2017-09-26 | Disposition: A | Discharge status: Home / Self Care | Payer: Medicaid Other | Source: Ambulatory Visit | Attending: Family Medicine | Admitting: Family Medicine

## 2017-09-26 ENCOUNTER — Other Ambulatory Visit: Payer: Self-pay

## 2017-09-26 ENCOUNTER — Ambulatory Visit (INDEPENDENT_AMBULATORY_CARE_PROVIDER_SITE_OTHER): Payer: Medicaid Other | Admitting: Family Medicine

## 2017-09-26 VITALS — BP 112/70 | HR 64 | Temp 98.1°F | Wt 145.0 lb

## 2017-09-26 DIAGNOSIS — Z3A37 37 weeks gestation of pregnancy: Secondary | ICD-10-CM

## 2017-09-26 DIAGNOSIS — O0933 Supervision of pregnancy with insufficient antenatal care, third trimester: Secondary | ICD-10-CM

## 2017-09-26 DIAGNOSIS — Z3483 Encounter for supervision of other normal pregnancy, third trimester: Secondary | ICD-10-CM | POA: Insufficient documentation

## 2017-09-26 DIAGNOSIS — Z23 Encounter for immunization: Secondary | ICD-10-CM | POA: Diagnosis not present

## 2017-09-26 DIAGNOSIS — Z3A14 14 weeks gestation of pregnancy: Secondary | ICD-10-CM

## 2017-09-26 MED ORDER — PRENATAL MULTIVITAMIN CH: 1.0000 | ORAL_TABLET | Freq: Every day | ORAL | 3 refills | Status: DC

## 2017-09-26 NOTE — Progress Notes (Signed)
Nancy [name] is a 21 y.o. G1P0 at 1139w0d here for routine follow up.  She reports no concerns today. She and husband state that they did not follow up because they thought that they would be called for their next appointment and were waiting to hear back and finally called after a long time passed. Has been off prenatal vitamins since July. She reports good fetal movement. No leaking or bleeding or pelvic pressure. See flow sheet for details.  A/P: Pregnancy at 6439w0d. Doing well.   Pregnancy issues include  1) anemia and poor nutrition earlier in pregnancy that has improved with appropriate weight gain. 2) limited prenatal care due to miscommunication.  Infant feeding choice: breast  Contraception choice: condoms Infant circumcision desired: not applicable  GBS and gc/chlamydia testing results were collected today. Flu shot given today. CBC, RPR, HIV checked today. Patient instructed to go to health department for Tdap. Vertex presentation confirmed by ultrasound. Patient instructed to resume prenatal vitamins, sign up for childbirth classes and the importance of follow up.  Labor and fetal movement precautions reviewed via Swahili interpreter.  Follow up 1 week in OB clinic on 10/04/17.  [name], DO PGY-2, [facility] 09/26/2017 9:22 AM

## 2017-09-26 NOTE — Patient Instructions (Addendum)
Go to the health department Address: [address] [place], [place], [place] [address] to get the tetanus booster.  Take your prenatal vitamins  We are checking some labs today. If results require attention, either myself or my nurse will get in touch with you. If everything is normal, you will get a letter in the mail or a message in My Chart. Please give [place] a call if you do not hear from [place] after 2 weeks.   Please sign up for childbirth classes.  [phone] is the phone number for Pregnancy Classes or hospital tours at [facility].   You will be referred to  [email or web address] for more information on childbirth classes  At this site you may register for classes. You may sign up for a waiting list if classes are full. Please SIGN UP FOR THIS!.   When the waiting list becomes long, sometimes new classes can be added.   Come back in 1 week for OB clinic here in this building on 10/04/17 at 10:30am. Arrive 15 min early for check in     Third Trimester of Pregnancy The third trimester is from week 28 through week 40 (months 7 through 9). The third trimester is a time when the unborn baby (fetus) is growing rapidly. At the end of the ninth month, the fetus is about 20 inches in length and weighs 6-10 pounds. Body changes during your third trimester Your body will continue to go through many changes during pregnancy. The changes vary from woman to woman. During the third trimester:  Your weight will continue to increase. You can expect to gain 25-35 pounds (11-16 kg) by the end of the pregnancy.  You may begin to get stretch marks on your hips, abdomen, and breasts.  You may urinate more often because the fetus is moving lower into your pelvis and pressing on your bladder.  You may develop or continue to have heartburn. This is caused by increased hormones that slow down muscles in the digestive  tract.  You may develop or continue to have constipation because increased hormones slow digestion and cause the muscles that push waste through your intestines to relax.  You may develop hemorrhoids. These are swollen veins (varicose veins) in the rectum that can itch or be painful.  You may develop swollen, bulging veins (varicose veins) in your legs.  You may have increased body aches in the pelvis, back, or thighs. This is due to weight gain and increased hormones that are relaxing your joints.  You may have changes in your hair. These can include thickening of your hair, rapid growth, and changes in texture. Some women also have hair loss during or after pregnancy, or hair that feels dry or thin. Your hair will most likely return to normal after your baby is born.  Your breasts will continue to grow and they will continue to become tender. A yellow fluid (colostrum) may leak from your breasts. This is the first milk you are producing for your baby.  Your belly button may stick out.  You may notice more swelling in your hands, face, or ankles.  You may have increased tingling or numbness in your hands, arms, and legs. The skin on your belly may also feel numb.  You may feel short of breath because of your expanding uterus.  You may have more problems sleeping. This can be caused by the size of your belly, increased need to urinate, and an increase in your body's metabolism.  You may  notice the fetus "dropping," or moving lower in your abdomen (lightening).  You may have increased vaginal discharge.  You may notice your joints feel loose and you may have pain around your pelvic bone.  What to expect at prenatal visits You will have prenatal exams every 2 weeks until week 36. Then you will have weekly prenatal exams. During a routine prenatal visit:  You will be weighed to make sure you and the baby are growing normally.  Your blood pressure will be taken.  Your abdomen will be  measured to track your baby's growth.  The fetal heartbeat will be listened to.  Any test results from the previous visit will be discussed.  You may have a cervical check near your due date to see if your cervix has softened or thinned (effaced).  You will be tested for Group B streptococcus. This happens between 35 and 37 weeks.  Your health care provider may ask you:  What your birth plan is.  How you are feeling.  If you are feeling the baby move.  If you have had any abnormal symptoms, such as leaking fluid, bleeding, severe headaches, or abdominal cramping.  If you are using any tobacco products, including cigarettes, chewing tobacco, and electronic cigarettes.  If you have any questions.  Other tests or screenings that may be performed during your third trimester include:  Blood tests that check for low iron levels (anemia).  Fetal testing to check the health, activity level, and growth of the fetus. Testing is done if you have certain medical conditions or if there are problems during the pregnancy.  Nonstress test (NST). This test checks the health of your baby to make sure there are no signs of problems, such as the baby not getting enough oxygen. During this test, a belt is placed around your belly. The baby is made to move, and its heart rate is monitored during movement.  What is false labor? False labor is a condition in which you feel small, irregular tightenings of the muscles in the womb (contractions) that usually go away with rest, changing position, or drinking water. These are called Braxton Hicks contractions. Contractions may last for hours, days, or even weeks before true labor sets in. If contractions come at regular intervals, become more frequent, increase in intensity, or become painful, you should see your health care provider. What are the signs of labor?  Abdominal cramps.  Regular contractions that start at 10 minutes apart and become stronger and  more frequent with time.  Contractions that start on the top of the uterus and spread down to the lower abdomen and back.  Increased pelvic pressure and dull back pain.  A watery or bloody mucus discharge that comes from the vagina.  Leaking of amniotic fluid. This is also known as your "water breaking." It could be a slow trickle or a gush. Let your health care provider know if it has a color or strange odor. If you have any of these signs, call your health care provider right away, even if it is before your due date. Follow these instructions at home: Medicines  Follow your health care provider's instructions regarding medicine use. Specific medicines may be either safe or unsafe to take during pregnancy.  Take a prenatal vitamin that contains at least 600 micrograms (mcg) of folic acid.  If you develop constipation, try taking a stool softener if your health care provider approves. Eating and drinking  Eat a balanced diet that includes fresh  fruits and vegetables, whole grains, good sources of protein such as meat, eggs, or tofu, and low-fat dairy. Your health care provider will help you determine the amount of weight gain that is right for you.  Avoid raw meat and uncooked cheese. These carry germs that can cause birth defects in the baby.  If you have low calcium intake from food, talk to your health care provider about whether you should take a daily calcium supplement.  Eat four or five small meals rather than three large meals a day.  Limit foods that are high in fat and processed sugars, such as fried and sweet foods.  To prevent constipation: ? Drink enough fluid to keep your urine clear or pale yellow. ? Eat foods that are high in fiber, such as fresh fruits and vegetables, whole grains, and beans. Activity  Exercise only as directed by your health care provider. Most women can continue their usual exercise routine during pregnancy. Try to exercise for 30 minutes at  least 5 days a week. Stop exercising if you experience uterine contractions.  Avoid heavy lifting.  Do not exercise in extreme heat or humidity, or at high altitudes.  Wear low-heel, comfortable shoes.  Practice good posture.  You may continue to have sex unless your health care provider tells you otherwise. Relieving pain and discomfort  Take frequent breaks and rest with your legs elevated if you have leg cramps or low back pain.  Take warm sitz baths to soothe any pain or discomfort caused by hemorrhoids. Use hemorrhoid cream if your health care provider approves.  Wear a good support bra to prevent discomfort from breast tenderness.  If you develop varicose veins: ? Wear support pantyhose or compression stockings as told by your healthcare provider. ? Elevate your feet for 15 minutes, 3-4 times a day. Prenatal care  Write down your questions. Take them to your prenatal visits.  Keep all your prenatal visits as told by your health care provider. This is important. Safety  Wear your seat belt at all times when driving.  Make a list of emergency phone numbers, including numbers for family, friends, the hospital, and police and fire departments. General instructions  Avoid cat litter boxes and soil used by cats. These carry germs that can cause birth defects in the baby. If you have a cat, ask someone to clean the litter box for you.  Do not travel far distances unless it is absolutely necessary and only with the approval of your health care provider.  Do not use hot tubs, steam rooms, or saunas.  Do not drink alcohol.  Do not use any products that contain nicotine or tobacco, such as cigarettes and e-cigarettes. If you need help quitting, ask your health care provider.  Do not use any medicinal herbs or unprescribed drugs. These chemicals affect the formation and growth of the baby.  Do not douche or use tampons or scented sanitary pads.  Do not cross your legs for  long periods of time.  To prepare for the arrival of your baby: ? Take prenatal classes to understand, practice, and ask questions about labor and delivery. ? Make a trial run to the hospital. ? Visit the hospital and tour the maternity area. ? Arrange for maternity or paternity leave through employers. ? Arrange for family and friends to take care of pets while you are in the hospital. ? Purchase a rear-facing car seat and make sure you know how to install it in your car. ?  Pack your hospital bag. ? Prepare the baby's nursery. Make sure to remove all pillows and stuffed animals from the baby's crib to prevent suffocation.  Visit your dentist if you have not gone during your pregnancy. Use a soft toothbrush to brush your teeth and be gentle when you floss. Contact a health care provider if:  You are unsure if you are in labor or if your water has broken.  You become dizzy.  You have mild pelvic cramps, pelvic pressure, or nagging pain in your abdominal area.  You have lower back pain.  You have persistent nausea, vomiting, or diarrhea.  You have an unusual or bad smelling vaginal discharge.  You have pain when you urinate. Get help right away if:  Your water breaks before 37 weeks.  You have regular contractions less than 5 minutes apart before 37 weeks.  You have a fever.  You are leaking fluid from your vagina.  You have spotting or bleeding from your vagina.  You have severe abdominal pain or cramping.  You have rapid weight loss or weight gain.  You have shortness of breath with chest pain.  You notice sudden or extreme swelling of your face, hands, ankles, feet, or legs.  Your baby makes fewer than 10 movements in 2 hours.  You have severe headaches that do not go away when you take medicine.  You have vision changes. Summary  The third trimester is from week 28 through week 40, months 7 through 9. The third trimester is a time when the unborn baby (fetus)  is growing rapidly.  During the third trimester, your discomfort may increase as you and your baby continue to gain weight. You may have abdominal, leg, and back pain, sleeping problems, and an increased need to urinate.  During the third trimester your breasts will keep growing and they will continue to become tender. A yellow fluid (colostrum) may leak from your breasts. This is the first milk you are producing for your baby.  False labor is a condition in which you feel small, irregular tightenings of the muscles in the womb (contractions) that eventually go away. These are called Braxton Hicks contractions. Contractions may last for hours, days, or even weeks before true labor sets in.  Signs of labor can include: abdominal cramps; regular contractions that start at 10 minutes apart and become stronger and more frequent with time; watery or bloody mucus discharge that comes from the vagina; increased pelvic pressure and dull back pain; and leaking of amniotic fluid. This information is not intended to replace advice given to you by your health care provider. Make sure you discuss any questions you have with your health care provider. Document Released: 10/31/2001 Document Revised: 04/13/2016 Document Reviewed: 01/07/2013 Elsevier Interactive Patient Education  2017 [employer].

## 2017-09-27 ENCOUNTER — Telehealth: Payer: Self-pay | Admitting: Family Medicine

## 2017-09-27 ENCOUNTER — Encounter (HOSPITAL_COMMUNITY): Payer: Medicaid Other

## 2017-09-27 LAB — CBC
Hematocrit: 28.2 % — ABNORMAL LOW (ref 34.0–46.6)
Hemoglobin: 8.8 g/dL — ABNORMAL LOW (ref 11.1–15.9)
MCH: 23.3 pg — AB (ref 26.6–33.0)
MCHC: 31.2 g/dL — AB (ref 31.5–35.7)
MCV: 75 fL — ABNORMAL LOW (ref 79–97)
PLATELETS: 136 10*3/uL — AB (ref 150–379)
RBC: 3.77 x10E6/uL (ref 3.77–5.28)
RDW: 17.7 % — ABNORMAL HIGH (ref 12.3–15.4)
WBC: 6.2 10*3/uL (ref 3.4–10.8)

## 2017-09-27 LAB — HIV ANTIBODY (ROUTINE TESTING W REFLEX): HIV SCREEN 4TH GENERATION: NONREACTIVE

## 2017-09-27 LAB — CERVICOVAGINAL ANCILLARY ONLY
CHLAMYDIA, DNA PROBE: NEGATIVE
Neisseria Gonorrhea: NEGATIVE

## 2017-09-27 LAB — RPR: RPR Ser Ql: NONREACTIVE

## 2017-09-27 NOTE — Telephone Encounter (Addendum)
Contacted patient's mother via Swahili interpreter. Informed that I have been trying to contact patient without success. Advised that no urgent concern but that can they please have patient call me back today.  They voiced that they would have patient call us back today.   If patient calls back and can make appointment tomorrow at 10am for a [facility] (phone [phone]) on [address] then please keep appointment. If she does not call back by end of day or cannot make appointment, [facility] will need to be called for appointment reschedule on 10/02/17 at 9am and patient informed of this change.

## 2017-09-27 NOTE — Telephone Encounter (Signed)
Spoke to on call OB attending Dr. Earlene Plateravis who recommended feraheme x1.   [facility] at [facility] (phone [phone]) on [address]. Patient can receive either on 09/28/17 at 10am or 10/02/17 at 9am. Scheduled patient for tomorrow.  Called patient via phone Swahili language interpreter line. A female voiced picked up and disconnected. Called back and an message was left in Swahili.  Called back directly and was able to get TEPPCO PartnersSimon [name] on the line but he was not able to hear me. Called back x3 without success.

## 2017-09-28 NOTE — Telephone Encounter (Signed)
Attempted to contact [facility] to see if pt arrived, I was placed on hold for a long time, could not wait. I will try again later.

## 2017-09-30 ENCOUNTER — Inpatient Hospital Stay (HOSPITAL_COMMUNITY): Payer: Medicaid Other | Admitting: Anesthesiology

## 2017-09-30 ENCOUNTER — Encounter (HOSPITAL_COMMUNITY): Payer: Self-pay

## 2017-09-30 ENCOUNTER — Inpatient Hospital Stay (HOSPITAL_COMMUNITY)
Admission: AD | Admit: 2017-09-30 | Discharge: 2017-10-02 | DRG: 807 | Disposition: A | Discharge status: Home / Self Care | Payer: Medicaid Other | Source: Ambulatory Visit | Attending: Obstetrics & Gynecology | Admitting: Obstetrics & Gynecology

## 2017-09-30 ENCOUNTER — Other Ambulatory Visit: Payer: Self-pay

## 2017-09-30 DIAGNOSIS — D649 Anemia, unspecified: Secondary | ICD-10-CM | POA: Diagnosis present

## 2017-09-30 DIAGNOSIS — O9902 Anemia complicating childbirth: Secondary | ICD-10-CM | POA: Diagnosis present

## 2017-09-30 DIAGNOSIS — Z3A37 37 weeks gestation of pregnancy: Secondary | ICD-10-CM

## 2017-09-30 DIAGNOSIS — Z3483 Encounter for supervision of other normal pregnancy, third trimester: Secondary | ICD-10-CM | POA: Diagnosis present

## 2017-09-30 LAB — URINALYSIS, ROUTINE W REFLEX MICROSCOPIC
BILIRUBIN URINE: NEGATIVE
GLUCOSE, UA: NEGATIVE mg/dL
HGB URINE DIPSTICK: NEGATIVE
Ketones, ur: NEGATIVE mg/dL
NITRITE: NEGATIVE
PH: 5 (ref 5.0–8.0)
Protein, ur: 100 mg/dL — AB
SPECIFIC GRAVITY, URINE: 1.031 — AB (ref 1.005–1.030)

## 2017-09-30 LAB — CBC
HEMATOCRIT: 31.9 % — AB (ref 36.0–46.0)
Hemoglobin: 10.2 g/dL — ABNORMAL LOW (ref 12.0–15.0)
MCH: 23.8 pg — AB (ref 26.0–34.0)
MCHC: 32 g/dL (ref 30.0–36.0)
MCV: 74.5 fL — AB (ref 78.0–100.0)
Platelets: 150 10*3/uL (ref 150–400)
RBC: 4.28 MIL/uL (ref 3.87–5.11)
RDW: 17.3 % — ABNORMAL HIGH (ref 11.5–15.5)
WBC: 10.4 10*3/uL (ref 4.0–10.5)

## 2017-09-30 LAB — TYPE AND SCREEN
ABO/RH(D): O POS
Antibody Screen: NEGATIVE

## 2017-09-30 LAB — CULTURE, BETA STREP (GROUP B ONLY): Strep Gp B Culture: NEGATIVE

## 2017-09-30 LAB — ABO/RH: ABO/RH(D): O POS

## 2017-09-30 MED ORDER — SENNOSIDES-DOCUSATE SODIUM 8.6-50 MG PO TABS
2.0000 | ORAL_TABLET | ORAL | Status: DC
  Administered 2017-09-30 – 2017-10-02 (×2): 2 via ORAL
  Filled 2017-09-30 (×2): qty 2

## 2017-09-30 MED ORDER — DIPHENHYDRAMINE HCL 25 MG PO CAPS: 25.0000 mg | ORAL_CAPSULE | Freq: Four times a day (QID) | ORAL | Status: DC | PRN

## 2017-09-30 MED ORDER — LIDOCAINE HCL (PF) 1 % IJ SOLN
INTRAMUSCULAR | Status: AC
  Filled 2017-09-30: qty 30

## 2017-09-30 MED ORDER — OXYTOCIN 40 UNITS IN LACTATED RINGERS INFUSION - SIMPLE MED: 2.5000 [IU]/h | INTRAVENOUS | Status: DC

## 2017-09-30 MED ORDER — LIDOCAINE HCL (PF) 1 % IJ SOLN
INTRAMUSCULAR | Status: DC | PRN
  Administered 2017-09-30 (×2): 4 mL via EPIDURAL

## 2017-09-30 MED ORDER — MEASLES, MUMPS & RUBELLA VAC ~~LOC~~ INJ
0.5000 mL | INJECTION | Freq: Once | SUBCUTANEOUS | Status: DC
  Filled 2017-09-30: qty 0.5

## 2017-09-30 MED ORDER — OXYCODONE-ACETAMINOPHEN 5-325 MG PO TABS: 1.0000 | ORAL_TABLET | ORAL | Status: DC | PRN

## 2017-09-30 MED ORDER — SIMETHICONE 80 MG PO CHEW: 80.0000 mg | CHEWABLE_TABLET | ORAL | Status: DC | PRN

## 2017-09-30 MED ORDER — PHENYLEPHRINE 40 MCG/ML (10ML) SYRINGE FOR IV PUSH (FOR BLOOD PRESSURE SUPPORT)
PREFILLED_SYRINGE | INTRAVENOUS | Status: AC
  Filled 2017-09-30: qty 10

## 2017-09-30 MED ORDER — SOD CITRATE-CITRIC ACID 500-334 MG/5ML PO SOLN: 30.0000 mL | ORAL | Status: DC | PRN

## 2017-09-30 MED ORDER — ACETAMINOPHEN 325 MG PO TABS: 650.0000 mg | ORAL_TABLET | ORAL | Status: DC | PRN

## 2017-09-30 MED ORDER — EPHEDRINE 5 MG/ML INJ
10.0000 mg | INTRAVENOUS | Status: DC | PRN
  Filled 2017-09-30: qty 2

## 2017-09-30 MED ORDER — FENTANYL 2.5 MCG/ML BUPIVACAINE 1/10 % EPIDURAL INFUSION (WH - ANES)
14.0000 mL/h | INTRAMUSCULAR | Status: DC | PRN
  Administered 2017-09-30: 10 mL/h via EPIDURAL

## 2017-09-30 MED ORDER — FENTANYL 2.5 MCG/ML BUPIVACAINE 1/10 % EPIDURAL INFUSION (WH - ANES)
INTRAMUSCULAR | Status: AC
  Filled 2017-09-30: qty 100

## 2017-09-30 MED ORDER — ZOLPIDEM TARTRATE 5 MG PO TABS: 5.0000 mg | ORAL_TABLET | Freq: Every evening | ORAL | Status: DC | PRN

## 2017-09-30 MED ORDER — DIPHENHYDRAMINE HCL 50 MG/ML IJ SOLN: 12.5000 mg | INTRAMUSCULAR | Status: DC | PRN

## 2017-09-30 MED ORDER — COCONUT OIL OIL: 1.0000 "application " | TOPICAL_OIL | Status: DC | PRN

## 2017-09-30 MED ORDER — TETANUS-DIPHTH-ACELL PERTUSSIS 5-2.5-18.5 LF-MCG/0.5 IM SUSP: 0.5000 mL | Freq: Once | INTRAMUSCULAR | Status: DC

## 2017-09-30 MED ORDER — PHENYLEPHRINE 40 MCG/ML (10ML) SYRINGE FOR IV PUSH (FOR BLOOD PRESSURE SUPPORT)
80.0000 ug | PREFILLED_SYRINGE | INTRAVENOUS | Status: DC | PRN
  Filled 2017-09-30: qty 5

## 2017-09-30 MED ORDER — OXYCODONE-ACETAMINOPHEN 5-325 MG PO TABS
2.0000 | ORAL_TABLET | ORAL | Status: DC | PRN
Start: 2017-09-30 — End: 2017-09-30

## 2017-09-30 MED ORDER — DIBUCAINE 1 % RE OINT: 1.0000 "application " | TOPICAL_OINTMENT | RECTAL | Status: DC | PRN

## 2017-09-30 MED ORDER — ONDANSETRON HCL 4 MG PO TABS: 4.0000 mg | ORAL_TABLET | ORAL | Status: DC | PRN

## 2017-09-30 MED ORDER — IBUPROFEN 600 MG PO TABS
600.0000 mg | ORAL_TABLET | Freq: Four times a day (QID) | ORAL | Status: DC
  Administered 2017-09-30 – 2017-10-02 (×8): 600 mg via ORAL
  Filled 2017-09-30 (×8): qty 1

## 2017-09-30 MED ORDER — LIDOCAINE HCL (PF) 1 % IJ SOLN
30.0000 mL | INTRAMUSCULAR | Status: DC | PRN
  Filled 2017-09-30: qty 30

## 2017-09-30 MED ORDER — LACTATED RINGERS IV SOLN: 500.0000 mL | Freq: Once | INTRAVENOUS | Status: DC

## 2017-09-30 MED ORDER — ACETAMINOPHEN 325 MG PO TABS
650.0000 mg | ORAL_TABLET | ORAL | Status: DC | PRN
  Administered 2017-10-01: 650 mg via ORAL
  Filled 2017-09-30: qty 2

## 2017-09-30 MED ORDER — ONDANSETRON HCL 4 MG/2ML IJ SOLN: 4.0000 mg | INTRAMUSCULAR | Status: DC | PRN

## 2017-09-30 MED ORDER — OXYTOCIN BOLUS FROM INFUSION
500.0000 mL | Freq: Once | INTRAVENOUS | Status: AC
  Administered 2017-09-30: 500 mL via INTRAVENOUS

## 2017-09-30 MED ORDER — SODIUM CHLORIDE 0.9% FLUSH: 3.0000 mL | INTRAVENOUS | Status: DC | PRN

## 2017-09-30 MED ORDER — SODIUM CHLORIDE 0.9 % IV SOLN: 250.0000 mL | INTRAVENOUS | Status: DC | PRN

## 2017-09-30 MED ORDER — ONDANSETRON HCL 4 MG/2ML IJ SOLN: 4.0000 mg | Freq: Four times a day (QID) | INTRAMUSCULAR | Status: DC | PRN

## 2017-09-30 MED ORDER — LACTATED RINGERS IV SOLN: 500.0000 mL | INTRAVENOUS | Status: DC | PRN

## 2017-09-30 MED ORDER — LACTATED RINGERS IV SOLN
INTRAVENOUS | Status: DC
  Administered 2017-09-30: 12:00:00 via INTRAVENOUS

## 2017-09-30 MED ORDER — FENTANYL CITRATE (PF) 100 MCG/2ML IJ SOLN: 50.0000 ug | INTRAMUSCULAR | Status: DC | PRN

## 2017-09-30 MED ORDER — SODIUM CHLORIDE 0.9% FLUSH
3.0000 mL | Freq: Two times a day (BID) | INTRAVENOUS | Status: DC
  Administered 2017-09-30: 3 mL via INTRAVENOUS

## 2017-09-30 MED ORDER — PRENATAL MULTIVITAMIN CH
1.0000 | ORAL_TABLET | Freq: Every day | ORAL | Status: DC
  Administered 2017-10-01 – 2017-10-02 (×2): 1 via ORAL
  Filled 2017-09-30 (×2): qty 1

## 2017-09-30 MED ORDER — BENZOCAINE-MENTHOL 20-0.5 % EX AERO: 1.0000 "application " | INHALATION_SPRAY | CUTANEOUS | Status: DC | PRN

## 2017-09-30 MED ORDER — WITCH HAZEL-GLYCERIN EX PADS: 1.0000 "application " | MEDICATED_PAD | CUTANEOUS | Status: DC | PRN

## 2017-09-30 MED ORDER — OXYTOCIN 40 UNITS IN LACTATED RINGERS INFUSION - SIMPLE MED
INTRAVENOUS | Status: AC
  Administered 2017-09-30: 500 mL via INTRAVENOUS
  Filled 2017-09-30: qty 1000

## 2017-09-30 NOTE — Anesthesia Procedure Notes (Signed)
Epidural Patient location during procedure: OB Start time: 09/30/2017 1:32 PM End time: 09/30/2017 1:49 PM  Staffing Anesthesiologist: Heather RobertsSinger, Mylan Schwarz, MD Performed: anesthesiologist   Preanesthetic Checklist Completed: patient identified, site marked, pre-op evaluation, timeout performed, IV checked, risks and benefits discussed and monitors and equipment checked  Epidural Patient position: sitting Prep: DuraPrep Patient monitoring: heart rate, cardiac monitor, continuous pulse ox and blood pressure Approach: midline Location: L2-L3 Injection technique: LOR saline  Needle:  Needle type: Tuohy  Needle gauge: 17 G Needle length: 9 cm Needle insertion depth: 6 cm Catheter size: 20 Guage Catheter at skin depth: 11 cm Test dose: negative and Other  Assessment Events: blood not aspirated, injection not painful, no injection resistance and negative IV test  Additional Notes Informed consent obtained prior to proceeding including risk of failure, 1% risk of PDPH, risk of minor discomfort and bruising.  Discussed rare but serious complications including epidural abscess, permanent nerve injury, epidural hematoma.  Discussed alternatives to epidural analgesia and patient desires to proceed.  Timeout performed pre-procedure verifying patient name, procedure, and platelet count.  Patient tolerated procedure well.

## 2017-09-30 NOTE — H&P (Signed)
OBSTETRIC ADMISSION HISTORY AND PHYSICAL  Nancy [name] is a 21 y.o. female G1P0 with IUP at 1878w4d by US (03/30/17) presenting for SOL. [facility] for increased contractions and discomfort. While in MAU patient SROM clear fluid. She reports +FMs, no VB, no blurry vision, headaches or peripheral edema, and RUQ pain.  She plans on breast feeding. She request condoms  for birth control. [facility]  Dating: By US (03/30/17) --->  Estimated Date of Delivery: 10/17/17  Sono:  07/03/17  @[redacted]w[redacted]d , CWD, normal anatomy, cephalic presentation,  718g, 16%48% EFW   Prenatal History/Complications: -Limited PNC: missed prenatal visits between 5858w5d and 6155w0d -Language barrier: Swahili -Anemia in pregnancy: taking Iron supp   Past Medical History: Past Medical History:  Diagnosis Date  . Anemia     Past Surgical History: Past Surgical History:  Procedure Laterality Date  . NO PAST SURGERIES      Obstetrical History: OB History    Gravida Para Term Preterm AB Living   1             SAB TAB Ectopic Multiple Live Births                  Social History: Social History   Socioeconomic History  . Marital status: Married    Spouse name: None  . Number of children: None  . Years of education: None  . Highest education level: None  Social Needs  . Financial resource strain: None  . Food insecurity - worry: None  . Food insecurity - inability: None  . Transportation needs - medical: None  . Transportation needs - non-medical: None  Occupational History  . None  Tobacco Use  . Smoking status: Never Smoker  . Smokeless tobacco: Never Used  Substance and Sexual Activity  . Alcohol use: No  . Drug use: No  . Sexual activity: Yes  Other Topics Concern  . None  Social History Narrative  . None    Family History: Family History  Family history unknown: Yes    Allergies: No Known Allergies  Medications Prior  to Admission  Medication Sig Dispense Refill Last Dose  . docusate sodium (COLACE) 100 MG capsule Take 1 capsule (100 mg total) by mouth 2 (two) times daily. (Patient not taking: Reported on 09/30/2017) 10 capsule 0 Not Taking at Unknown time  . ferrous sulfate 325 (65 FE) MG tablet Take 1 tablet (325 mg total) by mouth daily with breakfast. (Patient not taking: Reported on 09/30/2017) 30 tablet 3 Not Taking at Unknown time  . Prenatal Vit-Fe Fumarate-FA (PRENATAL MULTIVITAMIN) TABS tablet Take 1 tablet daily by mouth. (Patient not taking: Reported on 09/30/2017) 30 tablet 3 Not Taking at Unknown time     Review of Systems   All systems reviewed and negative except as stated in HPI  Last menstrual period 12/08/2016, SpO2 100 %. General appearance: alert, cooperative and appears stated age Lungs: clear to auscultation bilaterally Heart: regular rate and rhythm Abdomen: soft, non-tender; bowel sounds normal Extremities: Homans sign is negative, no sign of DVT Presentation: cephalic Fetal monitoringBaseline: 150 bpm, Variability: Good {> 6 bpm) and Accelerations: Reactive Uterine activityFrequency: Every 1-2 minutes and Intensity: strong Dilation: 4 Effacement (%): 60 Station: -2 Exam by:: J. McCelllan, RN MAU   Prenatal labs: ABO, Rh: O/Positive/-- (05/21 10960925) Antibody: Negative (05/21 0925) Rubella: 6.53 (05/21 0925) RPR: Non Reactive (11/07 1021)  HBsAg: Negative (05/21 0925)  HIV:  non reactive (11/07 1021) GBS:   neg 1 hr Glucola missed due to limited [facility] screening  Quad WNL Anatomy US normal  Prenatal Transfer Tool  Maternal Diabetes: No history but unknown in current pregnancy due to no GTT on file ([facility] Genetic Screening: Normal Maternal Ultrasounds/Referrals: Normal Fetal Ultrasounds or other Referrals:  None Maternal Substance Abuse:  No Significant Maternal Medications:  None Significant Maternal Lab Results: Lab values include: Group B Strep  negative  Results for orders placed or performed during the hospital encounter of 09/30/17 (from the past 24 hour(s))  Urinalysis, Routine w reflex microscopic   Collection Time: 09/30/17 10:43 AM  Result Value Ref Range   Color, Urine AMBER (A) YELLOW   APPearance HAZY (A) CLEAR   Specific Gravity, Urine 1.031 (H) 1.005 - 1.030   pH 5.0 5.0 - 8.0   Glucose, UA NEGATIVE NEGATIVE mg/dL   Hgb urine dipstick NEGATIVE NEGATIVE   Bilirubin Urine NEGATIVE NEGATIVE   Ketones, ur NEGATIVE NEGATIVE mg/dL   Protein, ur 604100 (A) NEGATIVE mg/dL   Nitrite NEGATIVE NEGATIVE   Leukocytes, UA MODERATE (A) NEGATIVE   RBC / HPF 0-5 0 - 5 RBC/hpf   WBC, UA TOO NUMEROUS TO COUNT 0 - 5 WBC/hpf   Bacteria, UA RARE (A) NONE SEEN   Squamous Epithelial / LPF 0-5 (A) NONE SEEN   Mucus PRESENT     Patient Active Problem List   Diagnosis Date Noted  . Limited prenatal care 09/25/2017  . Pregnancy   . Anemia 03/30/2017    Assessment/Plan:  Nancy [name] is a 21 y.o. G1P0 at 1530w4d here for SOL and SROM@1206   #Labor: Admit to birthing suites. Expectant management #Pain: per patient request #FWB: cat 2 #ID:  GBS neg #MOF: breast #MOC: condoms #Circ:  N/a girl  Suella BroadKeriann S Minott, MD  09/30/2017, 12:11 PM  I have assessed this pt and agree with the above assessment

## 2017-09-30 NOTE — Plan of Care (Signed)
Progressing appropriately, up to bathroom

## 2017-09-30 NOTE — Anesthesia Preprocedure Evaluation (Signed)
Anesthesia Evaluation  Patient identified by MRN, date of birth, ID band Patient awake    Reviewed: Allergy & Precautions, NPO status , Patient's Chart, lab work & pertinent test results  Airway Mallampati: II  TM Distance: >3 FB Neck ROM: Full    Dental no notable dental hx. (+) Dental Advisory Given   Pulmonary neg pulmonary ROS,    Pulmonary exam normal        Cardiovascular negative cardio ROS Normal cardiovascular exam     Neuro/Psych negative neurological ROS  negative psych ROS   GI/Hepatic negative GI ROS, Neg liver ROS,   Endo/Other  negative endocrine ROS  Renal/GU negative Renal ROS  negative genitourinary   Musculoskeletal negative musculoskeletal ROS (+)   Abdominal   Peds negative pediatric ROS (+)  Hematology negative hematology ROS (+)   Anesthesia Other Findings   Reproductive/Obstetrics (+) Pregnancy                             Anesthesia Physical Anesthesia Plan  ASA: II  Anesthesia Plan: Epidural   Post-op Pain Management:    Induction:   PONV Risk Score and Plan:   Airway Management Planned: Natural Airway  Additional Equipment:   Intra-op Plan:   Post-operative Plan:   Informed Consent: I have reviewed the patients History and Physical, chart, labs and discussed the procedure including the risks, benefits and alternatives for the proposed anesthesia with the patient or authorized representative who has indicated his/her understanding and acceptance.     Plan Discussed with: Anesthesiologist  Anesthesia Plan Comments: (Interpreter was used for the interview. Difficult interview. )        Anesthesia Quick Evaluation

## 2017-09-30 NOTE — Lactation Note (Signed)
This note was copied from a baby's chart. Lactation Consultation Note  Patient Name: Girl Dickie LaSalama Hankerson ZOXWR'UToday's Date: 09/30/2017 Reason for consult: Initial assessment Infant is 1hr old & seen by Lactation for Initial assessment. RN from L&D called LC to see if someone could come help because mom's nipples were different than she had seen before. Baby was wrapped in blankets, held by a visitor when [facility] entered [facility]&D room. Language line on mobile ipad was in room with Swahili interpreter. RN stated mom had tried to BF baby right after delivery but baby was too sleepy & did not latch and mom appeared disappointed. Discussed importance of skin-to-skin and offered to help with BF. Asked if mom had been shown how to hand express and mom reported she had not so LC received confirmation that she could show her. No drops were seen and LC explained that this was normal; mom reported she did not want to try. Mom has dense breasts with compressible tissue but the tissue around the areola/ nipple was thick and rough feeling. When asked if mom wanted LC to assist with BF now, mom declined. Mom encouraged to feed baby 8-12 times/24 hours and with feeding cues.  Mom reported no questions at this time. Encouraged mom to ask for help as needed.  **Did not provide BF booklet or resources- will need to be provided with these when seen by lactation again.  Maternal Data Has patient been taught Hand Expression?: Yes  Feeding    LATCH Score                   Interventions    Lactation Tools Discussed/Used     Consult Status Consult Status: Follow-up Date: 10/01/17 Follow-up type: In-patient    Oneal GroutLaura C Jaxx Huish 09/30/2017, 5:21 PM

## 2017-09-30 NOTE — Progress Notes (Addendum)
G1 @ 37.[redacted] wksga. Presents to triage for r/o labor since 02. Denies LOF or bleeding. +FM  Speaks Swahili. Stratus used.   EFM applied SVE 4/60/-2  Provider notified. Report status of pt given. Orders received to walk pt for an hr if pt ok and recheck cervix in an hr. Give juice.  1140: Juice given.   1206: provider at bs. SVE 7 with SROM clear. MAU charge called to birthing charge. Room assigned to 168.  1210: pt to birthing via bed.

## 2017-10-01 LAB — RPR: RPR Ser Ql: NONREACTIVE

## 2017-10-01 NOTE — Progress Notes (Signed)
CSW received consult for LPNC.  Upon chart review, CSW notes that MOB had more than 3 visits, and therefore, does not qualify her for automatic CSW consult and infant drug screening.  Also, It is documented on her fourth PNV that there was "limited PNC due to miscommunication."  CSW is screening out consult at this time.  Please consult CSW if current concerns arise, by MOB's request, or if MOB scores 10 or greater/yes to question 10 on the Edinburgh Postpartum Depression Scale.   

## 2017-10-01 NOTE — Progress Notes (Signed)
POSTPARTUM PROGRESS NOTE  Post Partum Day 1  Subjective:  Nancy [name] is a 21 y.o. G1P1001 s/p SVD at 4070w4d.  No acute events overnight.  Pt denies problems with ambulating, voiding or po intake.  She denies nausea or vomiting.  Pain is well controlled.  She has had flatus. She has not had bowel movement.  Lochia Minimal.   Objective: Blood pressure (!) 110/45, pulse 77, temperature 97.6 F (36.4 C), temperature source Oral, resp. rate 18, height 4\' 8"  (1.422 m), weight 140 lb (63.5 kg), last menstrual period 12/08/2016, SpO2 100 %, unknown if currently breastfeeding.  Physical Exam:  General: alert, cooperative and no distress Chest: no respiratory distress Heart:regular rate, distal pulses intact Abdomen: soft, nontender,  Uterine Fundus: firm, appropriately tender DVT Evaluation: No calf swelling or tenderness Extremities: no edema Skin: warm, dry  Recent Labs    09/30/17 1215  HGB 10.2*  HCT 31.9*    Assessment/Plan: Nancy LaSalama Fager is a 21 y.o. G1P1001 s/p SVD at 370w4d   PPD#1 - Doing well Contraception: condoms Feeding: breast Dispo: Plan for discharge tomorrow.   LOS: 1 day   Lynnae PrudeKeriann S MinottMD 10/01/2017, 11:31 AM

## 2017-10-01 NOTE — Telephone Encounter (Signed)
Since patient is delivered and her Hgb was checked and improved no need for feraheme unless OB team feels is necessary. Will defer further management to OB team at this time.

## 2017-10-01 NOTE — Telephone Encounter (Signed)
Will check with MD if patient still needs to make her appointment on 10/02/17 as she just delivered yesterday and missed the appt last week.  I went ahead and cancelled OB clinic appointment for this week also.  If patient still needs to be seen with sickle cell soon will ask [facility] and have patient's nurse there relay this message to patient. Mancel Lardizabal,CMA

## 2017-10-01 NOTE — Anesthesia Postprocedure Evaluation (Signed)
Anesthesia Post Note  Patient: Nancy [name]  Procedure(s) Performed: AN AD HOC LABOR EPIDURAL     Patient location during evaluation: Mother Baby Anesthesia Type: Epidural Level of consciousness: awake Pain management: satisfactory to patient Vital Signs Assessment: post-procedure vital signs reviewed and stable Respiratory status: spontaneous breathing Cardiovascular status: stable Anesthetic complications: no    Last Vitals:  Vitals:   09/30/17 2234 10/01/17 0629  BP: (!) 132/57 (!) 110/45  Pulse: 75 77  Resp: 18 18  Temp: 37.7 C 36.4 C  SpO2:      Last Pain:  Vitals:   10/01/17 0833  TempSrc:   PainSc: 0-No pain   Pain Goal:                 Cephus ShellingBURGER,Meshilem Machuca

## 2017-10-02 ENCOUNTER — Encounter (HOSPITAL_COMMUNITY): Payer: Medicaid Other

## 2017-10-02 MED ORDER — SENNOSIDES-DOCUSATE SODIUM 8.6-50 MG PO TABS: 2.0000 | ORAL_TABLET | ORAL | 0 refills | Status: DC

## 2017-10-02 MED ORDER — IBUPROFEN 600 MG PO TABS: 600.0000 mg | ORAL_TABLET | Freq: Four times a day (QID) | ORAL | 0 refills | Status: DC

## 2017-10-02 NOTE — Discharge Summary (Signed)
OB Discharge Summary     Patient Name: Dickie LaSalama Beegle DOB: [birth year] MRN: [id number]  Date of admission: 09/30/2017 Delivering MD: Zerita BoersLAWSON, DARLENE   Date of discharge: 10/02/2017  Admitting diagnosis: 37 wks abd pain Intrauterine pregnancy: 4878w4d     Secondary diagnosis:  Active Problems:   Indication for care in labor or delivery  Additional problems: Patient Active Problem List   Diagnosis Date Noted  . Indication for care in labor or delivery 09/30/2017  . Limited prenatal care 09/25/2017  . Pregnancy   . Anemia 03/30/2017       Discharge diagnosis: Term Pregnancy Delivered                                                                                                Post partum procedures:none  Augmentation: none  Complications: None  Hospital course:  Onset of Labor With Vaginal Delivery     21 y.o. yo G1P1001 at 3578w4d was admitted in Active Labor on 09/30/2017. Patient had an uncomplicated labor course as follows:  Membrane Rupture Time/Date: 12:07 PM ,09/30/2017   Intrapartum Procedures: Episiotomy: None [1]                                         Lacerations:  None [1]  Patient had a delivery of a Viable infant. 09/30/2017  Information for the patient's newborn:  Marcellina MillinBabungire, Girl [place] [[id number]][[id number]]  Delivery Method: Vaginal, Spontaneous(Filed from Delivery Summary)    Pateint had an uncomplicated postpartum course.  She is ambulating, tolerating a regular diet, passing flatus, and urinating well. Patient is discharged home in stable condition on 10/02/17.   Physical exam  Vitals:   09/30/17 2234 10/01/17 0629 10/01/17 1730 10/02/17 0627  BP: (!) 132/57 (!) 110/45 127/64 120/61  Pulse: 75 77 72 60  Resp: 18 18 17 20   Temp: 99.9 F (37.7 C) 97.6 F (36.4 C) 98.2 F (36.8 C) 98.2 F (36.8 C)  TempSrc: Oral Oral Oral Oral  SpO2:      Weight:      Height:       General: alert, cooperative and no distress Lochia: appropriate Uterine Fundus:  firm Incision: N/A DVT Evaluation: No evidence of DVT seen on physical exam. Labs: Lab Results  Component Value Date   WBC 10.4 09/30/2017   HGB 10.2 (L) 09/30/2017   HCT 31.9 (L) 09/30/2017   MCV 74.5 (L) 09/30/2017   PLT 150 09/30/2017   CMP Latest Ref Rng & Units 03/31/2017  Glucose 65 - 99 mg/dL 75  BUN 6 - 20 mg/dL 5(L)  Creatinine 7.820.44 - 1.00 mg/dL 9.56(O0.43(L)  Sodium 130135 - 865145 mmol/L 134(L)  Potassium 3.5 - 5.1 mmol/L 3.6  Chloride 101 - 111 mmol/L 107  CO2 22 - 32 mmol/L 19(L)  Calcium 8.9 - 10.3 mg/dL 9.2  Total Protein 6.5 - 8.1 g/dL -  Total Bilirubin 0.3 - 1.2 mg/dL -  Alkaline Phos 38 - 784126 U/L -  AST 15 - 41 U/L -  ALT 14 - 54 U/L -    Discharge instruction: per After Visit Summary and "Baby and Me Booklet".  After visit meds:  Allergies as of 10/02/2017   No Known Allergies     Medication List    STOP taking these medications   docusate sodium 100 MG capsule Commonly known as:  COLACE   ferrous sulfate 325 (65 FE) MG tablet     TAKE these medications   ibuprofen 600 MG tablet Commonly known as:  ADVIL,MOTRIN Take 1 tablet (600 mg total) every 6 (six) hours by mouth.   prenatal multivitamin Tabs tablet Take 1 tablet daily by mouth.   senna-docusate 8.6-50 MG tablet Commonly known as:  Senokot-S Take 2 tablets daily by mouth. Start taking on:  10/03/2017       Diet: routine diet  Activity: Advance as tolerated. Pelvic rest for 6 weeks.   Outpatient follow up:4 weeks Follow up Appt:No future appointments. Follow up Visit:No Follow-up on file.  Postpartum contraception: Condoms  Newborn Data: Live born female  Birth Weight: 5 lb 8.4 oz (2505 g) APGAR: 8, 9  Newborn Delivery   Birth date/time:  09/30/2017 15:55:00 Delivery type:  Vaginal, Spontaneous     Baby Feeding: Breast Disposition:home with mother   10/02/2017 Suella BroadKeriann S  Ducre, MD

## 2017-10-02 NOTE — Plan of Care (Signed)
Bonding well with newborn. Working on feedings, both breastfeeding and pumping with DEBP for newborn.Tolerating activity well.

## 2017-10-02 NOTE — Lactation Note (Addendum)
This note was copied from a baby's chart. Lactation Consultation Note  Patient Name: Nancy [name] MVHQI'OToday's Date: 10/02/2017 Reason for consult: Follow-up assessment;Hyperbilirubinemia;Infant < 6lbs;Early term 6537-38.6wks  Baby 6343 hours old. Assisted by Swahili interpreter "Satie" [phone]#[id number]. Mom reports that she is having trouble latching baby to breast, but she is continuing to attempt to latch with NS. Mom states that she is then supplementing baby with EBM using a bottle. MGma doing most of the reporting though LC continued to address patient directly. Enc mom to put baby to breast with cues and at least by 3 hours. Reviewed supply and demand and enc feeding/pumping more often as the baby is willing. Assisted mom to latch baby to right breast in cradle position. Attempted to help mom with positioning and supporting baby at breast, but mom only wanting/able to support baby by cradling and pushing onto NS--difficult for mom to manipulate NS and sleepy baby with lights.   Baby nursed off-and-on for 15 minutes with only 1 drop of colostrum in NS. Baby fussy at breast and mom having trouble with latch, so enc mom to use DEBP. Using a separate Swahili interpreter obtained by Irving BurtonEmily, RN, mom and Gma report that they gave baby 20 ml of EBM that mom had pumped earlier and mom able to pump an additional 44 ml of EBM which is at the bedside ready for the next feeding. Enc mom to put baby to breast with cues and at least by 3 hours, then supplement with EBM and then post-pump. Mom refitted with #16 NS for better fit. [facility] and enc to contact. Mom also [facility] and [facility] was [facility] to [facility] office.   Mom and Gma report complete understanding and state that they have no additional questions at this time. Discussed assessment and interventions with Irving BurtonEmily, RN.   Maternal Data Does the patient have breastfeeding experience prior to this delivery?: No  Feeding Feeding  Type: Breast Fed Nipple Type: Slow - flow Length of feed: 15 min  LATCH Score Latch: Repeated attempts needed to sustain latch, nipple held in mouth throughout feeding, stimulation needed to elicit sucking reflex.  Audible Swallowing: None  Type of Nipple: Everted at rest and after stimulation  Comfort (Breast/Nipple): Soft / non-tender  Hold (Positioning): Assistance needed to correctly position infant at breast and maintain latch.  LATCH Score: 6  Interventions Interventions: Breast feeding basics reviewed;Assisted with latch;Adjust position;Support pillows;Position options  Lactation Tools Discussed/Used Tools: Nipple [facility]Sh[facility] No   Consult Status Consult Status: Follow-up Date: 10/03/17 Follow-up type: In-patient    Sherlyn HayJennifer D Fatin Bachicha 10/02/2017, 11:28 AM

## 2017-10-02 NOTE — Progress Notes (Signed)
Swahili interpreter Nancy [name] [phone]#[id number] used.

## 2017-10-02 NOTE — Discharge Instructions (Signed)
Breastfeeding °Deciding to breastfeed is one of the best choices you can make for you and your baby. A change in hormones during pregnancy causes your breast tissue to grow and increases the number and size of your milk ducts. These hormones also allow proteins, sugars, and fats from your blood supply to make breast milk in your milk-producing glands. Hormones prevent breast milk from being released before your baby is born as well as prompt milk flow after birth. Once breastfeeding has begun, thoughts of your baby, as well as his or her sucking or crying, can stimulate the release of milk from your milk-producing glands. °Benefits of breastfeeding °For Your Baby °· Your first milk (colostrum) helps your baby's digestive system function better. °· There are antibodies in your milk that help your baby fight off infections. °· Your baby has a lower incidence of asthma, allergies, and sudden infant death syndrome. °· The nutrients in breast milk are better for your baby than infant formulas and are designed uniquely for your baby’s needs. °· Breast milk improves your baby's brain development. °· Your baby is less likely to develop other conditions, such as childhood obesity, asthma, or type 2 diabetes mellitus. ° °For You °· Breastfeeding helps to create a very special bond between you and your baby. °· Breastfeeding is convenient. Breast milk is always available at the correct temperature and costs nothing. °· Breastfeeding helps to burn calories and helps you lose the weight gained during pregnancy. °· Breastfeeding makes your uterus contract to its prepregnancy size faster and slows bleeding (lochia) after you give birth. °· Breastfeeding helps to lower your risk of developing type 2 diabetes mellitus, osteoporosis, and breast or ovarian cancer later in life. ° °Signs that your baby is hungry °Early Signs of Hunger °· Increased alertness or activity. °· Stretching. °· Movement of the head from side to  side. °· Movement of the head and opening of the mouth when the corner of the mouth or cheek is stroked (rooting). °· Increased sucking sounds, smacking lips, cooing, sighing, or squeaking. °· Hand-to-mouth movements. °· Increased sucking of fingers or hands. ° °Late Signs of Hunger °· Fussing. °· Intermittent crying. ° °Extreme Signs of Hunger °Signs of extreme hunger will require calming and consoling before your baby will be able to breastfeed successfully. Do not wait for the following signs of extreme hunger to occur before you initiate breastfeeding: °· Restlessness. °· A loud, strong cry. °· Screaming. ° °Breastfeeding basics °Breastfeeding Initiation °· Find a comfortable place to sit or lie down, with your neck and back well supported. °· Place a pillow or rolled up blanket under your baby to bring him or her to the level of your breast (if you are seated). Nursing pillows are specially designed to help support your arms and your baby while you breastfeed. °· Make sure that your baby's abdomen is facing your abdomen. °· Gently massage your breast. With your fingertips, massage from your chest wall toward your nipple in a circular motion. This encourages milk flow. You may need to continue this action during the feeding if your milk flows slowly. °· Support your breast with 4 fingers underneath and your thumb above your nipple. Make sure your fingers are well away from your nipple and your baby’s mouth. °· Stroke your baby's lips gently with your finger or nipple. °· When your baby's mouth is open wide enough, quickly bring your baby to your breast, placing your entire nipple and as much of the colored area   around your nipple (areola) as possible into your baby's mouth. °? More areola should be visible above your baby's upper lip than below the lower lip. °? Your baby's tongue should be between his or her lower gum and your breast. °· Ensure that your baby's mouth is correctly positioned around your nipple  (latched). Your baby's lips should create a seal on your breast and be turned out (everted). °· It is common for your baby to suck about 2-3 minutes in order to start the flow of breast milk. ° °Latching °Teaching your baby how to latch on to your breast properly is very important. An improper latch can cause nipple pain and decreased milk supply for you and poor weight gain in your baby. Also, if your baby is not latched onto your nipple properly, he or she may swallow some air during feeding. This can make your baby fussy. Burping your baby when you switch breasts during the feeding can help to get rid of the air. However, teaching your baby to latch on properly is still the best way to prevent fussiness from swallowing air while breastfeeding. °Signs that your baby has successfully latched on to your nipple: °· Silent tugging or silent sucking, without causing you pain. °· Swallowing heard between every 3-4 sucks. °· Muscle movement above and in front of his or her ears while sucking. ° °Signs that your baby has not successfully latched on to nipple: °· Sucking sounds or smacking sounds from your baby while breastfeeding. °· Nipple pain. ° °If you think your baby has not latched on correctly, slip your finger into the corner of your baby’s mouth to break the suction and place it between your baby's gums. Attempt breastfeeding initiation again. °Signs of Successful Breastfeeding °Signs from your baby: °· A gradual decrease in the number of sucks or complete cessation of sucking. °· Falling asleep. °· Relaxation of his or her body. °· Retention of a small amount of milk in his or her mouth. °· Letting go of your breast by himself or herself. ° °Signs from you: °· Breasts that have increased in firmness, weight, and size 1-3 hours after feeding. °· Breasts that are softer immediately after breastfeeding. °· Increased milk volume, as well as a change in milk consistency and color by the fifth day of  breastfeeding. °· Nipples that are not sore, cracked, or bleeding. ° °Signs That Your Baby is Getting Enough Milk °· Wetting at least 1-2 diapers during the first 24 hours after birth. °· Wetting at least 5-6 diapers every 24 hours for the first week after birth. The urine should be clear or pale yellow by 5 days after birth. °· Wetting 6-8 diapers every 24 hours as your baby continues to grow and develop. °· At least 3 stools in a 24-hour period by age 5 days. The stool should be soft and yellow. °· At least 3 stools in a 24-hour period by age 7 days. The stool should be seedy and yellow. °· No loss of weight greater than 10% of birth weight during the first 3 days of age. °· Average weight gain of 4-7 ounces (113-198 g) per week after age 4 days. °· Consistent daily weight gain by age 5 days, without weight loss after the age of 2 weeks. ° °After a feeding, your baby may spit up a small amount. This is common. °Breastfeeding frequency and duration °Frequent feeding will help you make more milk and can prevent sore nipples and breast engorgement. Breastfeed when   you feel the need to reduce the fullness of your breasts or when your baby shows signs of hunger. This is called "breastfeeding on demand." Avoid introducing a pacifier to your baby while you are working to establish breastfeeding (the first 4-6 weeks after your baby is born). After this time you may choose to use a pacifier. Research has shown that pacifier use during the first year of a baby's life decreases the risk of sudden infant death syndrome (SIDS). °Allow your baby to feed on each breast as long as he or she wants. Breastfeed until your baby is finished feeding. When your baby unlatches or falls asleep while feeding from the first breast, offer the second breast. Because newborns are often sleepy in the first few weeks of life, you may need to awaken your baby to get him or her to feed. °Breastfeeding times will vary from baby to baby. However,  the following rules can serve as a guide to help you ensure that your baby is properly fed: °· Newborns (babies 4 weeks of age or younger) may breastfeed every 1-3 hours. °· Newborns should not go longer than 3 hours during the day or 5 hours during the night without breastfeeding. °· You should breastfeed your baby a minimum of 8 times in a 24-hour period until you begin to introduce solid foods to your baby at around 6 months of age. ° °Breast milk pumping °Pumping and storing breast milk allows you to ensure that your baby is exclusively fed your breast milk, even at times when you are unable to breastfeed. This is especially important if you are going back to work while you are still breastfeeding or when you are not able to be present during feedings. Your lactation consultant can give you guidelines on how long it is safe to store breast milk. °A breast pump is a machine that allows you to pump milk from your breast into a sterile bottle. The pumped breast milk can then be stored in a refrigerator or freezer. Some breast pumps are operated by hand, while others use electricity. Ask your lactation consultant which type will work best for you. Breast pumps can be purchased, but some hospitals and breastfeeding support groups lease breast pumps on a monthly basis. A lactation consultant can teach you how to hand express breast milk, if you prefer not to use a pump. °Caring for your breasts while you breastfeed °Nipples can become dry, cracked, and sore while breastfeeding. The following recommendations can help keep your breasts moisturized and healthy: °· Avoid using soap on your nipples. °· Wear a supportive bra. Although not required, special nursing bras and tank tops are designed to allow access to your breasts for breastfeeding without taking off your entire bra or top. Avoid wearing underwire-style bras or extremely tight bras. °· Air dry your nipples for 3-4 minutes after each feeding. °· Use only cotton  bra pads to absorb leaked breast milk. Leaking of breast milk between feedings is normal. °· Use lanolin on your nipples after breastfeeding. Lanolin helps to maintain your skin's normal moisture barrier. If you use pure lanolin, you do not need to wash it off before feeding your baby again. Pure lanolin is not toxic to your baby. You may also hand express a few drops of breast milk and gently massage that milk into your nipples and allow the milk to air dry. ° °In the first few weeks after giving birth, some women experience extremely full breasts (engorgement). Engorgement can make your   breasts feel heavy, warm, and tender to the touch. Engorgement peaks within 3-5 days after you give birth. The following recommendations can help ease engorgement: °· Completely empty your breasts while breastfeeding or pumping. You may want to start by applying warm, moist heat (in the shower or with warm water-soaked hand towels) just before feeding or pumping. This increases circulation and helps the milk flow. If your baby does not completely empty your breasts while breastfeeding, pump any extra milk after he or she is finished. °· Wear a snug bra (nursing or regular) or tank top for 1-2 days to signal your body to slightly decrease milk production. °· Apply ice packs to your breasts, unless this is too uncomfortable for you. °· Make sure that your baby is latched on and positioned properly while breastfeeding. ° °If engorgement persists after 48 hours of following these recommendations, contact your health care provider or a lactation consultant. °Overall health care recommendations while breastfeeding °· Eat healthy foods. Alternate between meals and snacks, eating 3 of each per day. Because what you eat affects your breast milk, some of the foods may make your baby more irritable than usual. Avoid eating these foods if you are sure that they are negatively affecting your baby. °· Drink milk, fruit juice, and water to  satisfy your thirst (about 10 glasses a day). °· Rest often, relax, and continue to take your prenatal vitamins to prevent fatigue, stress, and anemia. °· Continue breast self-awareness checks. °· Avoid chewing and smoking tobacco. Chemicals from cigarettes that pass into breast milk and exposure to secondhand smoke may harm your baby. °· Avoid alcohol and drug use, including marijuana. °Some medicines that may be harmful to your baby can pass through breast milk. It is important to ask your health care provider before taking any medicine, including all over-the-counter and prescription medicine as well as vitamin and herbal supplements. °It is possible to become pregnant while breastfeeding. If birth control is desired, ask your health care provider about options that will be safe for your baby. °Contact a health care provider if: °· You feel like you want to stop breastfeeding or have become frustrated with breastfeeding. °· You have painful breasts or nipples. °· Your nipples are cracked or bleeding. °· Your breasts are red, tender, or warm. °· You have a swollen area on either breast. °· You have a fever or chills. °· You have nausea or vomiting. °· You have drainage other than breast milk from your nipples. °· Your breasts do not become full before feedings by the fifth day after you give birth. °· You feel sad and depressed. °· Your baby is too sleepy to eat well. °· Your baby is having trouble sleeping. °· Your baby is wetting less than 3 diapers in a 24-hour period. °· Your baby has less than 3 stools in a 24-hour period. °· Your baby's skin or the white part of his or her eyes becomes yellow. °· Your baby is not gaining weight by 5 days of age. °Get help right away if: °· Your baby is overly tired (lethargic) and does not want to wake up and feed. °· Your baby develops an unexplained fever. °This information is not intended to replace advice given to you by your health care provider. Make sure you discuss  any questions you have with your health care provider. °Document Released: 11/06/2005 Document Revised: 04/19/2016 Document Reviewed: 04/30/2013 °Elsevier Interactive Patient Education © 2017 [employer] °Home Care Instructions for Mom °ACTIVITY °·   Gradually return to your regular activities. °· Let yourself rest. Nap while your baby sleeps. °· Avoid lifting anything that is heavier than 10 lb (4.5 kg) until your health care provider says it is okay. °· Avoid activities that take a lot of effort and energy (are strenuous) until approved by your health care provider. Walking at a slow-to-moderate pace is usually safe. °· If you had a cesarean delivery: °? Do not vacuum, climb stairs, or drive a car for 4-6 weeks. °? Have someone help you at home until you feel like you can do your usual activities yourself. °? Do exercises as told by your health care provider, if this applies. ° °VAGINAL BLEEDING °You may continue to bleed for 4-6 weeks after delivery. Over time, the amount of blood usually decreases and the color of the blood usually gets lighter. However, the flow of bright red blood may increase if you have been too active. If you need to use more than one pad in an hour because your pad gets soaked, or if you pass a large clot: °· Lie down. °· Raise your feet. °· Place a cold compress on your lower abdomen. °· Rest. °· Call your health care provider. ° °If you are breastfeeding, your period should return anytime between 8 weeks after delivery and the time that you stop breastfeeding. If you are not breastfeeding, your period should return 6-8 weeks after delivery. °PERINEAL CARE °The perineal area, or perineum, is the part of your body between your thighs. After delivery, this area needs special care. Follow these instructions as told by your health care provider. °· Take warm tub baths for 15-20 minutes. °· Use medicated pads and pain-relieving sprays and creams as told. °· Do not use tampons or douches until  vaginal bleeding has stopped. °· Each time you go to the bathroom: °? Use a peri bottle. °? Change your pad. °? Use towelettes in place of toilet paper until your stitches have healed. °· Do Kegel exercises every day. Kegel exercises help to maintain the muscles that support the vagina, bladder, and bowels. You can do these exercises while you are standing, sitting, or lying down. To do Kegel exercises: °? Tighten the muscles of your abdomen and the muscles that surround your birth canal. °? Hold for a few seconds. °? Relax. °? Repeat until you have done this 5 times in a row. °· To prevent hemorrhoids from developing or getting worse: °? Drink enough fluid to keep your urine clear or pale yellow. °? Avoid straining when having a bowel movement. °? Take over-the-counter medicines and stool softeners as told by your health care provider. ° °BREAST CARE °· Wear a tight-fitting bra. °· Avoid taking over-the-counter pain medicine for breast discomfort. °· Apply ice to the breasts to help with discomfort as needed: °? Put ice in a plastic bag. °? Place a towel between your skin and the bag. °? Leave the ice on for 20 minutes or as told by your health care provider. ° °NUTRITION °· Eat a well-balanced diet. °· Do not try to lose weight quickly by cutting back on calories. °· Take your prenatal vitamins until your postpartum checkup or until your health care provider tells you to stop. ° °POSTPARTUM DEPRESSION °You may find yourself crying for no apparent reason and unable to cope with all of the changes that come with having a newborn. This mood is called postpartum depression. Postpartum depression happens because your hormone levels change after delivery. If you have   postpartum depression, get support from your partner, friends, and family. If the depression does not go away on its own after several weeks, contact your health care provider. °BREAST SELF-EXAM °Do a breast self-exam each month, at the same time of the  month. If you are breastfeeding, check your breasts just after a feeding, when your breasts are less full. If you are breastfeeding and your period has started, check your breasts on day 5, 6, or 7 of your period. °Report any lumps, bumps, or discharge to your health care provider. Know that breasts are normally lumpy if you are breastfeeding. This is temporary, and it is not a health risk. °INTIMACY AND SEXUALITY °Avoid sexual activity for at least 3-4 weeks after delivery or until the brownish-red vaginal flow is completely gone. If you want to avoid pregnancy, use some form of birth control. You can get pregnant after delivery, even if you have not had your period. °SEEK MEDICAL CARE IF: °· You feel unable to cope with the changes that a child brings to your life, and these feelings do not go away after several weeks. °· You notice a lump, a bump, or discharge on your breast. ° °SEEK IMMEDIATE MEDICAL CARE IF: °· Blood soaks your pad in 1 hour or less. °· You have: °? Severe pain or cramping in your lower abdomen. °? A bad-smelling vaginal discharge. °? A fever that is not controlled by medicine. °? A fever, and an area of your breast is red and sore. °? Pain or redness in your calf. °? Sudden, severe chest pain. °? Shortness of breath. °? Painful or bloody urination. °? Problems with your vision. °· You vomit for 12 hours or longer. °· You develop a severe headache. °· You have serious thoughts about hurting yourself, your child, or anyone else. ° °This information is not intended to replace advice given to you by your health care provider. Make sure you discuss any questions you have with your health care provider. °Document Released: 11/03/2000 Document Revised: 04/13/2016 Document Reviewed: 05/10/2015 °Elsevier Interactive Patient Education © 2017 [employer] ° °

## 2017-10-03 ENCOUNTER — Ambulatory Visit: Payer: Self-pay

## 2017-10-03 NOTE — Lactation Note (Addendum)
This note was copied from a baby's chart. Lactation Consultation Note  Patient Name: Nancy [name] ZOXWR'UToday's Date: 10/03/2017  Nancy GarfinkelInterpreter, Nancy [name], used during consultation. Mom is engorged. Diapers with ice cold water were put to breasts. Mom knows to remove in 15 minutes. Mom also placed in shells to promote leaking. Awaiting cabbage leaves.   Mom to feed infant soon (with bottle of EBM). Mom to pump soon, also. Mom took IB at 672000; Mom knows to take again at 0200 (as instructed by her RN).   Mom & MGM have been using bottle nipples twice. Instructed to only use one time before discarding.  Nancy [name], Nancy [name] [facility] 10/03/2017, 10:08 PM

## 2017-10-04 ENCOUNTER — Ambulatory Visit: Payer: Medicaid Other

## 2017-10-04 ENCOUNTER — Ambulatory Visit: Payer: Self-pay

## 2017-10-04 NOTE — Lactation Note (Signed)
This note was copied from a baby's chart. Lactation Consultation Note  Patient Name: Nancy [name] GUYQI'HToday's Date: 10/04/2017   Interpreter, "Karie MainlandAli" [phone](# [phone]) used to explain that Mom's extra EBM is being refrigerated. 75ml was left at the bedside for the next feeding. Mom says she knows how to put her pump parts back together to pump.   Mom had pumped 4.5 oz with her last session. At the end of her pumping session, Mom's breasts were not engorged, but very full. However, her breasts seem to be filling quickly, so another attempt at obtaining cabbage will be made. Mom placed in shells to promote leaking.   RN also in room.  Lurline HareRichey, Marce Schartz [facility] 10/04/2017, 12:02 AM

## 2017-10-04 NOTE — Lactation Note (Signed)
This note was copied from a baby's chart. Lactation Consultation Note  Patient Name: Nancy [name] ZOXWR'UToday's Date: 10/04/2017 Reason for consult: Follow-up assessment   Swahili interpreter [phone] used. < 6 lbs. Baby 90 hours old and on phototherapy and sleeping. Mom has my # to call for assist w/next feeding. Mother's breasts are engorged.  Had mother lie flat on bed and did reverse pressure softening which softened a good portion of the breast. Then applied ice packs x 4 to breasts. Mother recently pumped 92 ml of breastfmilk. She has been primarily pumping and bottle feeding. She states she has had trouble latching the baby. Offered to help if mother calls. [facility]. Encouraged ice q2-3 hours for 10-15 min.   Maternal Data    Feeding Feeding Type: Bottle Fed - Breast Milk  LATCH Score                   Interventions    Lactation Tools Discussed/Used     Consult Status Consult Status: Follow-up Date: 10/05/17 Follow-up type: In-patient    Dahlia ByesBerkelhammer, [facility] 10/04/2017, 10:38 AM

## 2017-10-05 ENCOUNTER — Ambulatory Visit: Payer: Self-pay

## 2017-10-05 NOTE — Lactation Note (Signed)
This note was copied from a baby's chart. Lactation Consultation Note:   Mother plans discharge today. [place] used for [employer]ahili language with all teaching. Mother has been using a DEBP while in the hospital. Mother was given a hand pump and advised to pump every 2-3 hours for 15 mins on each breast.  Mother reports that she wants an electric pump. Mother informed that I was unable to issue her an electric pump if she was not active with WIC. Mother reports that she is not active with WIC. I gave mother another hand pump and also advised mother how to use the Double manual pump with someone assisting. Mother was fit with a #30 flange. Lots of teaching done. Advised mother in cleaning parts and drying parts well. Mother informed of milk storage guidelines.  Discussed treatment to prevent engorgement. Advised mother to be consistent with pumping every 2-3 hours.  [facility] and advised to phone as soon as possible or [facility].   I left a note on WIC's cart at 11;30, to please see this mother to certify her for  [facility]. So she could get an electric pump.  [facility] would see mother after they had seen all their other patients and before leaving the hospital for the day.  1500, Staff nurse reports that [facility] that they are not able to see mother because she had to leave she was in overtime.    Mother reminded that she had milk in the refrigerator and not to forget to take it home. Mother is aware of available LC services. Mother ask several times if she had any questions or concerns. Mother denies futher questions.   Patient Name: Nancy [name] MVHQI'OToday's Date: 10/05/2017     Maternal Data    Feeding Feeding Type: Bottle Fed - Breast Milk  LATCH Score                   Interventions    Lactation Tools Discussed/Used     Consult Status      Michel BickersKendrick, Giana Castner McCoy 10/05/2017,  3:58 PM

## 2017-11-15 ENCOUNTER — Ambulatory Visit (INDEPENDENT_AMBULATORY_CARE_PROVIDER_SITE_OTHER): Payer: Medicaid Other | Admitting: Family Medicine

## 2017-11-15 ENCOUNTER — Encounter: Payer: Self-pay | Admitting: Family Medicine

## 2017-11-15 ENCOUNTER — Other Ambulatory Visit: Payer: Self-pay

## 2017-11-15 DIAGNOSIS — D509 Iron deficiency anemia, unspecified: Secondary | ICD-10-CM | POA: Diagnosis not present

## 2017-11-15 LAB — POCT HEMOGLOBIN: Hemoglobin: 9.7 g/dL — AB (ref 12.2–16.2)

## 2017-11-15 MED ORDER — FERROUS SULFATE 325 (65 FE) MG PO TABS: 325.0000 mg | ORAL_TABLET | Freq: Every day | ORAL | 3 refills | Status: DC

## 2017-11-15 NOTE — Patient Instructions (Signed)
Keep taking prenatal vitamin and iron pills.  We will check blood level today.

## 2017-11-15 NOTE — Progress Notes (Signed)
    Subjective:  Nancy [name] is a 21 y.o. female [facility]  HPI:  Postpartum visit: patient is 6.5 weeks postpartum following a spontaneous vaginal delivery. -Breastfeeding: going well, supplementing with formula. Has established with WIC. Taking prenatal vitamin -Contraception: condoms -Bleeding: none -Bonding with infant: feels like is bonding well with baby -Sexual activity: none yet -Mood: is fine, Edinburgh score 10. No SI or HI.   Objective:  Physical Exam: BP 112/74   Pulse 80   Temp 98.1 F (36.7 C) (Oral)   Ht 4\' 8"  (1.422 m)   Wt 128 lb (58.1 kg)   SpO2 99%   Breastfeeding? Yes   BMI 28.70 kg/m   Gen: NAD, resting comfortably CV: RRR with no murmurs appreciated Pulm: NWOB, CTAB with no crackles, wheezes, or rhonchi GI: Normal bowel sounds present. Soft, Nontender, Nondistended. MSK: no edema, cyanosis, or clubbing noted Skin: warm, dry Neuro: grossly normal, moves all extremities Psych: Normal affect and thought content   Results for orders placed or performed in visit on 11/15/17 (from the past 72 hour(s))  POCT hemoglobin     Status: Abnormal   Collection Time: 11/15/17 11:23 AM  Result Value Ref Range   Hemoglobin 9.7 (A) 12.2 - 16.2 g/dL     Assessment/Plan:  Anemia Check POC Hgb as patient has a history of significant iron def anemia. Stable today. Discussed continued daily iron supplementation.  Routine postpartum follow-up Concern for [place] score 10 with teenage mother with [name] first baby and not much familial support. Situation is complicated by language barrier. No red flags on history or exam. Discussed finding support at local church that patient attends, she stated that she would be comfortable reaching out to some other Swahili speaking women there.   Nancy HerElsia [name] Nancy Heinsohn, DO PGY-2, [facility] 11/15/2017 10:47 AM

## 2017-11-15 NOTE — Assessment & Plan Note (Signed)
Check POC Hgb as patient has a history of significant iron def anemia. Stable today. Discussed continued daily iron supplementation.

## 2017-11-15 NOTE — Assessment & Plan Note (Signed)
Concern for [place] score 10 with teenage mother with her first baby and not much familial support. Situation is complicated by language barrier. No red flags on history or exam. Discussed finding support at local church that patient attends, she stated that she would be comfortable reaching out to some other Swahili speaking women there.

## 2021-03-30 DIAGNOSIS — F819 Developmental disorder of scholastic skills, unspecified: Secondary | ICD-10-CM | POA: Insufficient documentation

## 2021-03-30 DIAGNOSIS — Z8673 Personal history of transient ischemic attack (TIA), and cerebral infarction without residual deficits: Secondary | ICD-10-CM | POA: Insufficient documentation

## 2021-03-30 DIAGNOSIS — Z550 Illiteracy and low-level literacy: Secondary | ICD-10-CM | POA: Insufficient documentation

## 2022-04-14 NOTE — Progress Notes (Deleted)
Subjective:    Patient ID: Nancy [name], female    DOB: 18-Feb-1996, 26 y.o.   MRN: [id number]  Swahili interpreter #*** used for entirety of visit  CC: Establish care  HPI:  Nancy [name] is a very pleasant 26 y.o. female who presents today to establish care.  Initial concerns: Missed period ***    LMP 4/16.  Recently seen in the ED on 5/13 for abdominal pain.  UA with 500 leukocytes, few bacteria and many squamous epithelial cells though she was still treated for UTI with keflex given her symptoms. She was found to be pregnant at that time with beta HCG 1147 and ultrasound showed gestational sac measuring approximately 5 weeks. No definitive yolk sak or fetal pole was identified.    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  Past surgical history:***  Current medications:***  Family history:***  Social history:***  ROS: pertinent noted in the HPI   Objective:  There were no vitals taken for this visit.  Vitals and nursing note reviewed  General: NAD, pleasant, able to participate in exam Cardiac: RRR, S1 S2 present. normal heart sounds, no murmurs. Respiratory: CTAB, normal effort, No wheezes, rales or rhonchi Abdomen: Bowel sounds present, non-tender, non-distended, no hepatosplenomegaly Extremities: no edema or cyanosis. Skin: warm and dry, no rashes noted Neuro: alert, no obvious focal deficits Psych: Normal affect and mood   Assessment & Plan:    No problem-specific Assessment & Plan notes found for this encounter.    Sabino Dick, DO Family Medicine Resident

## 2022-04-19 ENCOUNTER — Ambulatory Visit: Payer: Medicaid Other | Admitting: Family Medicine

## 2022-04-30 NOTE — Progress Notes (Signed)
   Subjective:    Patient ID: Nancy [name], female    DOB: 29-Sep-1996, 26 y.o.   MRN: [id number]  Husband interpreted as well as Lucy.   CC: Establish care  HPI:  Nancy [name] is a very pleasant 26 y.o. female who presents today to establish care.  Initial concerns: Missed period   LMP 4/16.  Recently seen in the ED on 5/13 for abdominal pain.  UA with 500 leukocytes, few bacteria and many squamous epithelial cells though she was still treated for UTI with keflex given her symptoms. She was found to be pregnant at that time with beta HCG 1147 and ultrasound showed gestational sac measuring approximately 5 weeks. No definitive yolk sak or fetal pole was identified. Her LMP was 03/09/2022. She had irregular periods.  She has had one other pregnancy. Son is 46 years old. Vaginal delivery without complications.   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  Past surgical history: None  Current medications: They are unsure what she is taking.   Family history: None  Social history: Husband, and son. Doesn't work. No alcohol or alcohol.  ROS: pertinent noted in the HPI   Objective:  BP 98/70   Pulse 76   Temp 98.1 F (36.7 C)   Ht 4\' 8"  (1.422 m)   Wt 120 lb 3.2 oz (54.5 kg)   LMP 03/09/2022 (Exact Date)   SpO2 98%   BMI 26.95 kg/m    Vitals and nursing note reviewed  General: NAD, pleasant, able to participate in exam Cardiac: RRR, S1 S2 present. normal heart sounds, no murmurs. Respiratory: CTAB, normal effort, No wheezes, rales or rhonchi Abdomen: Uterus not appreciated above umbilicus  Extremities: no edema or cyanosis. Skin: warm and dry, no rashes noted Psych: Normal affect and mood   Assessment & Plan:   1. Encounter for supervision of other normal pregnancy in first trimester Obtaining routine lab work. Scheduled for dating ultrasound tomorrow (due to irregular periods). Encouraged her to bring her home meds to follow up appointment so  that we can have them on file. Scheduled for 1-hr initial prenatal visit with Dr. 03/11/2022. - CBC/D/Plt+RPR+Rh+ABO+RubIgG... - Hgb Fractionation Cascade - Prenatal Vit-Fe Fumarate-FA (PRENATAL MULTIVITAMIN) TABS tablet; Take 1 tablet by mouth daily.  Dispense: 30 tablet; Refill: 3 - Jena Gauss OP OB Comp Less 14 Wks; Future  [place], DO Family Medicine Resident

## 2022-05-02 ENCOUNTER — Ambulatory Visit: Payer: BC Managed Care – PPO | Admitting: Family Medicine

## 2022-05-02 ENCOUNTER — Encounter: Payer: Self-pay | Admitting: Family Medicine

## 2022-05-02 VITALS — BP 98/70 | HR 76 | Temp 98.1°F | Ht <= 58 in | Wt 120.2 lb

## 2022-05-02 DIAGNOSIS — Z3490 Encounter for supervision of normal pregnancy, unspecified, unspecified trimester: Secondary | ICD-10-CM | POA: Diagnosis not present

## 2022-05-02 DIAGNOSIS — Z3481 Encounter for supervision of other normal pregnancy, first trimester: Secondary | ICD-10-CM

## 2022-05-02 MED ORDER — PRENATAL MULTIVITAMIN CH: 1.0000 | ORAL_TABLET | Freq: Every day | ORAL | 3 refills | Status: AC

## 2022-05-02 NOTE — Patient Instructions (Addendum)
It was wonderful to see you today.  Please bring ALL of your medications with you to every visit.   Today we talked about:  -You are scheduled for an hour-long OB appointment on 6/21 at 8:45 AM.  -We are doing blood work today.  -We will get you scheduled for an ultrasound. -Please bring your medications to the next appointment! -Take a prenatal vitamin daily.   Thank you for choosing [facility].   Please call [phone] with any questions about today's appointment.  Please be sure to schedule follow up at the front  desk before you leave today.   Sabino Dick, DO PGY-2 Family Medicine

## 2022-05-03 ENCOUNTER — Ambulatory Visit
Admission: RE | Admit: 2022-05-03 | Discharge: 2022-05-03 | Disposition: A | Discharge status: Home / Self Care | Payer: BC Managed Care – PPO | Source: Ambulatory Visit | Attending: Family Medicine | Admitting: Family Medicine

## 2022-05-03 DIAGNOSIS — Z3490 Encounter for supervision of normal pregnancy, unspecified, unspecified trimester: Secondary | ICD-10-CM

## 2022-05-03 DIAGNOSIS — Z3491 Encounter for supervision of normal pregnancy, unspecified, first trimester: Secondary | ICD-10-CM | POA: Diagnosis not present

## 2022-05-05 LAB — CBC/D/PLT+RPR+RH+ABO+RUBIGG...
Antibody Screen: NEGATIVE
Basophils Absolute: 0 10*3/uL (ref 0.0–0.2)
Basos: 0 %
Bilirubin, UA: NEGATIVE
EOS (ABSOLUTE): 0.1 10*3/uL (ref 0.0–0.4)
Eos: 2 %
Glucose, UA: NEGATIVE
HCV Ab: NONREACTIVE
HIV Screen 4th Generation wRfx: NONREACTIVE
Hematocrit: 32.4 % — ABNORMAL LOW (ref 34.0–46.6)
Hemoglobin: 10.8 g/dL — ABNORMAL LOW (ref 11.1–15.9)
Hepatitis B Surface Ag: NEGATIVE
Immature Grans (Abs): 0 10*3/uL (ref 0.0–0.1)
Immature Granulocytes: 0 %
Ketones, UA: NEGATIVE
Lymphocytes Absolute: 0.9 10*3/uL (ref 0.7–3.1)
Lymphs: 28 %
MCH: 31.2 pg (ref 26.6–33.0)
MCHC: 33.3 g/dL (ref 31.5–35.7)
MCV: 94 fL (ref 79–97)
Monocytes Absolute: 0.3 10*3/uL (ref 0.1–0.9)
Monocytes: 10 %
Neutrophils Absolute: 1.9 10*3/uL (ref 1.4–7.0)
Neutrophils: 60 %
Nitrite, UA: NEGATIVE
Platelets: 151 10*3/uL (ref 150–450)
RBC, UA: NEGATIVE
RBC: 3.46 x10E6/uL — ABNORMAL LOW (ref 3.77–5.28)
RDW: 13.8 % (ref 11.7–15.4)
RPR Ser Ql: NONREACTIVE
Rh Factor: POSITIVE
Rubella Antibodies, IGG: 3.23 index (ref >0.99)
Specific Gravity, UA: >=1.03 — AB (ref 1.005–1.030)
Urobilinogen, Ur: 1 mg/dL (ref 0.2–1.0)
WBC: 3.1 10*3/uL — ABNORMAL LOW (ref 3.4–10.8)
pH, UA: 7 (ref 5.0–7.5)

## 2022-05-05 LAB — HCV INTERPRETATION

## 2022-05-05 LAB — MICROSCOPIC EXAMINATION
Casts: NONE SEEN /lpf
Epithelial Cells (non renal): >10 /hpf — AB (ref 0–10)
RBC, Urine: NONE SEEN /hpf (ref 0–2)

## 2022-05-05 LAB — HGB FRACTIONATION CASCADE
Hgb A2: 2.7 % (ref 1.8–3.2)
Hgb A: 97.3 % (ref 96.4–98.8)
Hgb F: 0 % (ref 0.0–2.0)
Hgb S: 0 %

## 2022-05-05 LAB — URINE CULTURE, OB REFLEX

## 2022-05-10 ENCOUNTER — Ambulatory Visit (INDEPENDENT_AMBULATORY_CARE_PROVIDER_SITE_OTHER): Payer: BC Managed Care – PPO | Admitting: Student

## 2022-05-10 ENCOUNTER — Other Ambulatory Visit: Payer: Self-pay

## 2022-05-10 ENCOUNTER — Encounter: Payer: Self-pay | Admitting: Student

## 2022-05-10 VITALS — BP 124/67 | HR 81 | Wt 123.4 lb

## 2022-05-10 DIAGNOSIS — Z3A10 10 weeks gestation of pregnancy: Secondary | ICD-10-CM | POA: Diagnosis not present

## 2022-05-10 DIAGNOSIS — D649 Anemia, unspecified: Secondary | ICD-10-CM | POA: Diagnosis not present

## 2022-05-10 DIAGNOSIS — O99011 Anemia complicating pregnancy, first trimester: Secondary | ICD-10-CM | POA: Diagnosis not present

## 2022-05-10 DIAGNOSIS — Z3490 Encounter for supervision of normal pregnancy, unspecified, unspecified trimester: Secondary | ICD-10-CM | POA: Insufficient documentation

## 2022-05-10 MED ORDER — FERROUS SULFATE 325 (65 FE) MG PO TABS
325.0000 mg | ORAL_TABLET | Freq: Every day | ORAL | 3 refills | Status: DC
Start: 2022-05-10 — End: 2022-07-07

## 2022-05-10 NOTE — Patient Instructions (Addendum)
It was great to see you today! Thank you for choosing [facility] for your primary care. Nancy [name] was seen for first prenatal visit.   Please come back in 4 weeks and will need to have a pap smear and pelvic exam at this time  We will continue with iron tablets once daily   Commonly Asked Questions During Pregnancy  How Will I Feel When I'm Pregnant? Pregnancy symptoms in the first trimester of pregnancy may not appear until the middle or end of the second month. Hormonal changes will cause tenderness in your breasts, and you may begin to feel more tired than usual. Food cravings, an increase in the need to urinate, and morning sickness may all be more noticeable.  Pregnancy symptoms in the second trimester are more prominent. You may start to feel the baby move and become more active. Dental issues, nasal/sinus problems, and skin irritations can begin to appear. Heartburn, leg cramps, dizziness, and a vaginal discharge are also common. Every woman is different when it comes to the symptoms they experience, and some may not experience any at all. Pregnancy symptoms in the third trimester can include increased frequency in urination, leg cramps, constipation, ligament pain in the abdomen, and weight gain. Back pain and Braxton Hicks contractions will become increasingly more common.  Why is nutrition during pregnancy important? Eating well is one of the best things you can do during pregnancy. Good nutrition helps you handle the extra demands on your body as your pregnancy progresses. The goal is to balance getting enough nutrients to support the growth of your fetus and maintaining a healthy weight.  How much water should I drink during pregnancy? During pregnancy you should drink 8 to 12 cups (64 to 96 ounces) of water every day. Water has many benefits. It aids digestion and helps form the amniotic fluid around the fetus. Water also helps nutrients circulate in the body and helps  waste leave the body.  What can I do to help with nausea? Eat dry toast or crackers in the morning before you get out of bed to avoid moving around on an empty stomach. Eat five or six "mini meals" a day to ensure that your stomach is never empty. Eat frequent bites of foods like nuts, fruits, or crackers.  What can help with constipation during pregnancy? Constipation is common near the end of pregnancy. Eating more foods with fiber can help fight constipation. Fiber is found in fruits, vegetables, whole grains, beans, nuts, and seeds. You should aim for about 25 grams of fiber in your diet each day. Drink a lot of water as you increase your fiber intake.  How much coffee can I drink while I'm pregnant? Research suggests that moderate caffeine consumption (less than 200 milligrams per day) does not cause miscarriage or preterm birth. That's the amount in one 12-ounce cup of coffee. Remember that caffeine also is found in tea, chocolate, energy drinks, and soft drinks. Caffeine can interfere with sleep and contribute to nausea and light-headedness. Caffeine also can increase urination and lead to dehydration.  What can I do to prevent or ease back pain during pregnancy? There are several things you can do to prevent or ease back pain. For example, wear supportive clothing and shoes. Pay attention to your position when sitting, sleeping, and lifting things. If you need to stand for a long time, rest one foot on a stool or a box to take the strain off your back. You also can  use heat or cold to soothe sore muscles.  Is it safe to exercise during pregnancy? If you are healthy and your pregnancy is normal, it is safe to continue or start regular physical activity. Physical activity does not increase your risk of miscarriage, low birth weight, or early delivery. It's still important to discuss exercise with your ob-gyn provider during your early prenatal visits.   What are the benefits of exercise  during pregnancy? Regular exercise during pregnancy benefits you and your fetus in these key ways: Reduces back pain Eases constipation May decrease your risk of gestational diabetes, preeclampsia, and cesarean birth Promotes healthy weight gain during pregnancy Improves your overall fitness and strengthens your heart and blood vessels Helps you to lose the baby weight after your baby is born  Is it safe to dye my hair during pregnancy? Yes, it's safe. Only a small amount of chemicals from hair dye is absorbed through the scalp.  Is it safe to keep a cat during pregnancy? Yes, you can keep your cat. You may have heard that cat feces can carry the infection toxoplasmosis. This infection is only found in cats who go outdoors and hunt prey, such as mice and other rodents. If you do have a cat who goes outdoors or eats prey, have someone else take over daily cleaning the litter box. This will keep you away from any cat feces. If you have an indoor cat who only eats cat food and doesn't have contact with outside animals, your risk of toxoplasmosis is very low.  What substances should I avoid during pregnancy? During pregnancy, women should not use tobacco, alcohol, marijuana, illegal drugs, or prescription medications for nonmedical reasons. Avoiding these substances and getting regular prenatal care are important to having a healthy pregnancy and a healthy baby.   What foods to I need to avoid in pregnancy? To help prevent listeriosis, avoid eating the following foods while you are pregnant: Unpasteurized milk and foods made with unpasteurized milk, including soft cheeses Hot dogs and luncheon meats, unless they are heated until steaming hot just before serving Unwashed raw produce such as fruits and vegetables  Avoid all raw and undercooked seafood, eggs, meat, and poultry while you are pregnant. Do not eat sushi made with raw fish (cooked sushi is safe). Cooking and pasteurization are the only  ways to kill Listeria.  Limit your exposure to mercury by not eating bigeye tuna, king mackerel, marlin, orange roughy, shark, swordfish, or tilefish. Limit eating white (albacore) tuna to 6 ounces a week. You do not have to avoid all fish during pregnancy. In fact, fish and shellfish are nutritious foods with vital nutrients for a pregnant woman and her fetus. Be sure to eat at least 8-12 ounces of low-mercury fish and shellfish per week.  Is travel safe to during pregnancy? In most cases, pregnant women can travel safely until close to their due dates. But travel may not be recommended for women who have pregnancy complications. If you are planning a trip, talk with your (ob-gyn) provider. And no matter how you choose to travel, think ahead about your comfort and safety.  Can I use a sauna or hot tub early in pregnancy? It's best not to. Your core body temperature rises when you use saunas and hot tubs. This rise in temperature can be harmful for your fetus.  Can I get a massage while pregnant? Yes. Massage is a good way to relax and improve circulation. The best position for a massage while  you're pregnant is lying on your side, rather than facedown. Some massage tables have a cut-out for the belly, allowing you to lie facedown comfortably. Tell your massage therapist that you're pregnant if you're not showing yet. Many health spas offer special prenatal massages done by therapists who are trained to work on pregnant women.  Is Having Dental Work While Pregnant Safe? Pregnancy and dental work questions are common for expecting moms. Preventive dental cleanings and annual exams during pregnancy are not only safe but are recommended. The rise in hormone levels during pregnancy causes the gums to swell, bleed, and trap food causing increased irritation to your gums. Preventive dental work while pregnant is essential to avoid oral infections such as gum disease, which has been linked to preterm  birth. The American Dental Association (ADA) recommends pregnant women eat a balanced diet, brush their teeth thoroughly with ADA-approved fluoride toothpaste twice a day, and floss daily. Have preventive exams and cleanings during your pregnancy. Let your dentist know you are pregnant. Postpone non-emergency dental work until the second trimester or after delivery, if possible. Elective procedures should be postponed until after the delivery.      Please present to the MAU if you develop any of the following: - fever of 100.4*F or higher - worsening bleeding  - worsening pain not relieved by tylenol or ibuprofen - feel unsafe caring for yourself or baby   No orders of the defined types were placed in this encounter.  Meds ordered this encounter  Medications   ferrous sulfate 325 (65 FE) MG tablet    Sig: Take 1 tablet (325 mg total) by mouth daily with breakfast.    Dispense:  30 tablet    Refill:  3    If you haven't already, sign up for My Chart to have easy access to your labs results, and communication with your primary care physician.   You should return to our clinic in 4 weeks   I recommend that you always bring your medications to each appointment as this makes it easy to ensure you are on the correct medications and helps [place] not miss refills when you need them.  Please arrive 15 minutes before your appointment to ensure smooth check in process.  We appreciate your efforts in making this happen.  Please call the clinic at [phone] if your symptoms worsen or you have any concerns.  Thank you for allowing me to participate in your care, Laneice Meneely [employer]

## 2022-05-10 NOTE — Progress Notes (Signed)
Patient Name: Nancy [name] Date of Birth: 06-30-1996 [facility] Initial Prenatal Visit  Nancy [name] is a 26 y.o. year old G2P1001 at [redacted]w[redacted]d GA via dating U/S who presents for her initial prenatal visit. Pregnancy is planned She reports frequent urination and fatigue She is taking a prenatal vitamin.  She denies pelvic pain or vaginal bleeding.   Pregnancy Dating: The patient is dated by U/S.  LMP: 03/09/22 Period is certain:  Yes.  Periods were regular:  No.  LMP was a typical period:  Yes.  Dating U/S performed   Currently being treated with Keflex for symptomatic UTI for total of 14 days   Lab Review: Blood type: O Rh Status: + Antibody screen: Negative HIV: Negative RPR: Negative Hemoglobin electrophoresis reviewed: Yes-- monitoring  Results of OB urine culture are: Negative Rubella: Immune Hep C Ab: Negative Varicella status: yes 2016   PMH: Reviewed and as detailed below: HTN: No  Gestational Hypertension/preeclampsia: No  Type 1 or 2 Diabetes: No  Depression:  No  Flowsheet Row Initial Prenatal from 05/10/2022 in [facility]  PHQ-9 Total Score 16      Seizure disorder:  No VTE: No ,  History of STI No,  Abnormal Pap smear:  No, Genital herpes simplex:  No   PSH: Gynecologic Surgery:  no Surgical history reviewed, notable for:  Past Surgical History:  Procedure Laterality Date   NO PAST SURGERIES      Obstetric History: Obstetric history tab updated and reviewed.  Summary of prior pregnancies:  Cesarean delivery: No  Gestational Diabetes:  No Hypertension in pregnancy: No History of preterm birth: No; 37 weeks SVD  History of LGA/SGA infant:  Yes; infant was 2lbs was in NICU for about 1 week  History of shoulder dystocia: No Indications for referral were reviewed, and the patient has no obstetric indications for referral to High Risk OB Clinic at this time.   Social History: Partner's name:  Melvenia Beam Tobacco use: No Alcohol use:  No Other substance use:  No  Current Medications:  PNV and Keflex   Reviewed and appropriate in pregnancy.   Genetic and Infection Screen: Flow Sheet Updated Yes  Prenatal Exam: Gen: Well nourished, well developed.  No distress.  Vitals noted. HEENT: Normocephalic, atraumatic.  Neck supple without cervical lymphadenopathy, thyromegaly or thyroid nodules.  Fair dentition. CV: RRR no murmur, gallops or rubs Lungs: CTA B.  Normal respiratory effort without wheezes or rales. Abd: soft, NTND. +BS.  Uterus not appreciated above pelvis. GU: Normal external female genitalia without lesions.  Nl vaginal, well rugated without lesions. No vaginal discharge.  Bimanual exam: No adnexal mass or TTP. No CMT. Nml uterine size  Ext: No clubbing, cyanosis or edema. Psych: Normal grooming and dress.  Not depressed or anxious appearing.  Normal thought content and process without flight of ideas or looseness of associations  Fetal heart tones: not detected today; early in pregnancy   Assessment/Plan:  Nancy [name] is a 26 y.o. G2P1001 at [redacted]w[redacted]d GA via dating U/S who presents to initiate prenatal care. She is doing well.  Current pregnancy issues include none.  Routine prenatal care: A dating ultrasound has been ordered. Dating tab updated. Pre-pregnancy weight updated. Expected weight gain this pregnancy is 25-35 pounds  Prenatal labs reviewed, notable for anemia. Indications for referral to HROB were reviewed and the patient does not meet criteria for referral.  Medication list reviewed and updated.  Recommended patient see a dentist for regular  care.  Bleeding and pain precautions reviewed. Importance of prenatal vitamins reviewed.  Genetic screening offered. Patient opted for: no screening. She does not feel strongly about this.  The patient has the following indications for aspirin to begin 81 mg at 12-16 weeks: One high risk condition: no single high  risk condition  MORE than one moderate risk condition: low SES   and identifies as African American  Aspirin was recommended today based upon above risk factors (one high risk condition or more than one moderate risk factor)  The patient will not be age 83 or over at time of delivery. Referral to genetic counseling was not offered today.  The patient has the following risk factors for preexisting diabetes: BMI > 25 and high risk ethnicity (Latino, [place] American, Native American, [place] Islander, Asian Naval architect) . An early 1 hour glucose tolerance test was ordered. Pregnancy Medical Home and PHQ-9 forms completed, problems noted: Yes  2. Pregnancy issues include the following which were addressed today:   Patient declined pap smear, pelvic exam, or lab work today. She is amenable to getting these done next visit in 4 weeks.   She meets criteria for early glucola testing and will need ASA at 12 weeks.   Anemia in Pregnancy: Iron tablets ordered for patient   Completing Keflex for symptomatic UTI   Follow up 4 weeks for next prenatal visit.

## 2022-05-20 ENCOUNTER — Inpatient Hospital Stay (HOSPITAL_COMMUNITY)
Admission: AD | Admit: 2022-05-20 | Discharge: 2022-05-20 | Disposition: A | Discharge status: Home / Self Care | Payer: BC Managed Care – PPO | Attending: Emergency Medicine | Admitting: Emergency Medicine

## 2022-05-20 ENCOUNTER — Inpatient Hospital Stay (HOSPITAL_COMMUNITY): Payer: BC Managed Care – PPO

## 2022-05-20 DIAGNOSIS — S0990XA Unspecified injury of head, initial encounter: Secondary | ICD-10-CM

## 2022-05-20 DIAGNOSIS — R55 Syncope and collapse: Secondary | ICD-10-CM

## 2022-05-20 DIAGNOSIS — R519 Headache, unspecified: Secondary | ICD-10-CM | POA: Insufficient documentation

## 2022-05-20 DIAGNOSIS — O26891 Other specified pregnancy related conditions, first trimester: Secondary | ICD-10-CM | POA: Insufficient documentation

## 2022-05-20 DIAGNOSIS — W19XXXA Unspecified fall, initial encounter: Secondary | ICD-10-CM | POA: Insufficient documentation

## 2022-05-20 DIAGNOSIS — O9A219 Injury, poisoning and certain other consequences of external causes complicating pregnancy, unspecified trimester: Secondary | ICD-10-CM | POA: Insufficient documentation

## 2022-05-20 DIAGNOSIS — R42 Dizziness and giddiness: Secondary | ICD-10-CM | POA: Insufficient documentation

## 2022-05-20 LAB — CBC WITH DIFFERENTIAL/PLATELET
Abs Immature Granulocytes: 0.01 10*3/uL (ref 0.00–0.07)
Basophils Absolute: 0 10*3/uL (ref 0.0–0.1)
Basophils Relative: 0 %
Eosinophils Absolute: 0.1 10*3/uL (ref 0.0–0.5)
Eosinophils Relative: 1 %
HCT: 31.5 % — ABNORMAL LOW (ref 36.0–46.0)
Hemoglobin: 11 g/dL — ABNORMAL LOW (ref 12.0–15.0)
Immature Granulocytes: 0 %
Lymphocytes Relative: 25 %
Lymphs Abs: 1.2 10*3/uL (ref 0.7–4.0)
MCH: 32.1 pg (ref 26.0–34.0)
MCHC: 34.9 g/dL (ref 30.0–36.0)
MCV: 91.8 fL (ref 80.0–100.0)
Monocytes Absolute: 0.5 10*3/uL (ref 0.1–1.0)
Monocytes Relative: 11 %
Neutro Abs: 3.1 10*3/uL (ref 1.7–7.7)
Neutrophils Relative %: 63 %
Platelets: 150 10*3/uL (ref 150–400)
RBC: 3.43 MIL/uL — ABNORMAL LOW (ref 3.87–5.11)
RDW: 13 % (ref 11.5–15.5)
WBC: 4.9 10*3/uL (ref 4.0–10.5)
nRBC: 0 % (ref 0.0–0.2)

## 2022-05-20 LAB — URINALYSIS, ROUTINE W REFLEX MICROSCOPIC
Bilirubin Urine: NEGATIVE
Glucose, UA: NEGATIVE mg/dL
Hgb urine dipstick: NEGATIVE
Ketones, ur: NEGATIVE mg/dL
Nitrite: NEGATIVE
Protein, ur: 30 mg/dL — AB
Specific Gravity, Urine: 1.021 (ref 1.005–1.030)
pH: 8 (ref 5.0–8.0)

## 2022-05-20 LAB — COMPREHENSIVE METABOLIC PANEL
ALT: 12 U/L (ref 0–44)
AST: 15 U/L (ref 15–41)
Albumin: 3.8 g/dL (ref 3.5–5.0)
Alkaline Phosphatase: 47 U/L (ref 38–126)
Anion gap: 8 (ref 5–15)
BUN: <5 mg/dL — ABNORMAL LOW (ref 6–20)
CO2: 21 mmol/L — ABNORMAL LOW (ref 22–32)
Calcium: 8.8 mg/dL — ABNORMAL LOW (ref 8.9–10.3)
Chloride: 106 mmol/L (ref 98–111)
Creatinine, Ser: 0.33 mg/dL — ABNORMAL LOW (ref 0.44–1.00)
GFR, Estimated: >60 mL/min (ref >60)
Glucose, Bld: 91 mg/dL (ref 70–99)
Potassium: 3.8 mmol/L (ref 3.5–5.1)
Sodium: 135 mmol/L (ref 135–145)
Total Bilirubin: 0.2 mg/dL — ABNORMAL LOW (ref 0.3–1.2)
Total Protein: 7.1 g/dL (ref 6.5–8.1)

## 2022-05-20 MED ORDER — SODIUM CHLORIDE 0.9 % IV BOLUS: 1000.0000 mL | Freq: Once | INTRAVENOUS | Status: DC
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
## 2022-05-20 NOTE — Discharge Instructions (Addendum)
If you develop a new or worsening headache, vision changes, vomiting, chest pain, shortness of breath, or any other new/concerning symptoms then return to the ER for evaluation.

## 2022-05-20 NOTE — ED Notes (Signed)
Went to discharge patient, unable to locate patient in room. Pt did not receive discharge instructions prior to leaving.

## 2022-05-20 NOTE — MAU Note (Signed)
Asher Muir, ED Charge Nurse, called and report given. Patient to go to ED to sign in.

## 2022-05-20 NOTE — ED Notes (Signed)
Pt agreed to stay whenever the edp  took off the iv order the pt does not speaK ENGLISH  HER HUSBAND DOES SPEAK ENGLISH

## 2022-05-20 NOTE — ED Notes (Signed)
The pt does not want the iv she wan ts to leave  edp back in to see her

## 2022-05-20 NOTE — MAU Note (Signed)
Five unsuccessful attempts at reaching ED Charge RN for report to be given.  RN spoke with ED Secretary twice who transferred RN through with no success.
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
## 2022-05-20 NOTE — MAU Note (Signed)
ED Secretary states she located the ED Charge RN and RN to call back.

## 2022-07-07 ENCOUNTER — Ambulatory Visit (INDEPENDENT_AMBULATORY_CARE_PROVIDER_SITE_OTHER): Payer: Medicaid Other | Admitting: Family Medicine

## 2022-07-07 VITALS — BP 116/61 | HR 90 | Wt 126.4 lb

## 2022-07-07 DIAGNOSIS — O99011 Anemia complicating pregnancy, first trimester: Secondary | ICD-10-CM

## 2022-07-07 DIAGNOSIS — O99012 Anemia complicating pregnancy, second trimester: Secondary | ICD-10-CM | POA: Diagnosis not present

## 2022-07-07 DIAGNOSIS — Z3481 Encounter for supervision of other normal pregnancy, first trimester: Secondary | ICD-10-CM

## 2022-07-07 DIAGNOSIS — Z3A18 18 weeks gestation of pregnancy: Secondary | ICD-10-CM | POA: Diagnosis not present

## 2022-07-07 DIAGNOSIS — D649 Anemia, unspecified: Secondary | ICD-10-CM

## 2022-07-07 MED ORDER — ASPIRIN 81 MG PO TBEC: 81.0000 mg | DELAYED_RELEASE_TABLET | Freq: Every day | ORAL | 12 refills | Status: DC

## 2022-07-07 MED ORDER — FERROUS SULFATE 325 (65 FE) MG PO TABS: 325.0000 mg | ORAL_TABLET | Freq: Every day | ORAL | 3 refills | Status: DC

## 2022-07-07 NOTE — Patient Instructions (Signed)
Ilikuwa nzuri [place] leo!  Leo tumejadili ujauzito wako, nimefurahi mambo yanakwenda vizuri. Tafadhali chukua vitamini, aspirini na madini ya chuma wakati wa ujauzito kila siku.  Nenda kwa MAU katika [place] na [place] huko [place] ikiwa: ? Una maumivu katika tumbo la chini au eneo la pelvic ? Maji yako yanakatika. Wakati mwingine ni giligili [place], wakati mwingine ni mtirirko tu ambao unaendelea kupata chupi yako kulowa au kukimbia chini ya miguu yako. ? Una damu ukeni.  Tafadhali fuatilia miadi yako ijayo iliyopangwa baada ya wiki 2, ikiwa [place] chochote kitatokea kati ya sasa na wakati huo, tafadhali usisite kuwasiliana na ofisi yetu.   Asante kwa kuturuhusu kuwa sehemu ya utunzaji wako wa matibabu!  Asante, Dk. Robyne Peers  It was great seeing you today!  Today we discussed your pregnancy, I am glad things are going well. Please take your prenatal vitamin, aspirin and iron supplement daily.   Go to the MAU at [facility] at [facility] if: You have pain in your lower abdomen or pelvic area Your water breaks.  Sometimes it is a big gush of fluid, sometimes it is just a trickle that keeps getting your underwear wet or running down your legs You have vaginal bleeding.   Please follow up at your next scheduled appointment in 2 weeks, if anything arises between now and then, please don't hesitate to contact our office.   Thank you for allowing [place] to be a part of your medical care!  Thank you, Dr. Robyne Peers

## 2022-07-07 NOTE — Progress Notes (Signed)
  Patient Name: [name] Date of Birth: [birth year] [facility] Prenatal Visit  Nancy [name] is a 26 y.o. G2P1001 at [redacted]w[redacted]d here for routine follow up. She is dated by early ultrasound.  LMP 02/08/2022. She reports no complaints.  She denies vaginal bleeding.  See flow sheet for details.  Vitals:   07/07/22 1522  BP: 116/61  Pulse: 90     A/P: Pregnancy at [redacted]w[redacted]d.  Doing well.    Routine Prenatal Care:  Dating reviewed, dating tab is correct. Fetal heart tones appropriate. Influenza vaccine not given as it is not in season. COVID vaccination was discussed and received the first two vaccines.  The patient has  indication for screening preexisting diabetes: 1 hr glucola will be obtained at next visit.  Anatomy ultrasound ordered to be scheduled at 18-20 weeks. Patient is not interested in genetic screening.  Pregnancy education including expected weight gain in pregnancy, OTC medication use, continued use of prenatal vitamin, smoking cessation if applicable, and nutrition in pregnancy.   Bleeding and pain precautions reviewed.  2. Pregnancy issues include the following and were addressed as appropriate today:  Patient meets criteria for early diabetes testing, deferred today but plan to obtain 1 hr glucola at next visit. Patient agreeable. Most recent Hgb 11, prescribed iron supplementation for anemia during pregnancy but she did not receive the prescription so supplementation sent into desired pharmacy again. Discussed importance of taking this.  Discussed importance of taking prenatal vitamin daily. Problem list  and pregnancy box updated: Yes.  Meets criteria for aspirin, started at this visit.   Follow up 2 weeks for PAP smear and 1 hr glucola and then in another 2 weeks for next OB visit.  Swahili Video Interpretation Leonette Most [phone]) utilized throughout the entirety of this encounter.

## 2022-07-20 NOTE — Progress Notes (Deleted)
  Patient Name: Nancy [name] Date of Birth: 1996-06-14 [facility] Prenatal Visit  Swahili interpreter *** used for entirety of visit.   [name] is a 26 y.o. G2P1001 at [redacted]w[redacted]d here for 1-hour glucola and Pap smear. She is dated by early ultrasound.  She reports {symptoms; pregnancy related:14538}.  She denies vaginal bleeding.  See flow sheet for details.  There were no vitals filed for this visit. ***   A/P: Pregnancy at [redacted]w[redacted]d.  Doing well.    Routine Prenatal Care:  Dating reviewed, dating tab is {correct:23336::"correct"} Fetal heart tones {appropriate:23337} Influenza vaccine {given:23340}  COVID vaccination was discussed and ***.  The patient has the following indication for screening preexisting diabetes: BMI > 25 and high risk ethnicity (Latino, [place] American, Native American, [place] Islander, Asian Naval architect) . Anatomy ultrasound ordered to be scheduled at 18-20 weeks. Patient is not interested in genetic screening.  Pregnancy education including expected weight gain in pregnancy, OTC medication use, continued use of prenatal vitamin, smoking cessation if applicable, and nutrition in pregnancy.   Bleeding and pain precautions reviewed. The patient has the following indications for aspirinto begin 81 mg at 12-16 weeks: One high risk condition: no single high risk condition  MORE than one moderate risk condition: low SES   and identifies as African American  Aspirin was  recommended today based upon above risk factors (one high risk condition or more than one moderate risk factor)   2. Pregnancy issues include the following and were addressed as appropriate today:  Anemia: on iron supplementations Continue 81 mg ASA for pre-eclampsia ppx  1-hour GTT today *** Pap smear today without routine co-testing  F/u in 2 weeks for next prenatal exam Problem list  and pregnancy box updated: {yes/no:20286::"Yes"}.   Follow up 4 weeks.

## 2022-07-21 ENCOUNTER — Encounter: Payer: Medicaid Other | Admitting: Family Medicine

## 2022-08-06 NOTE — Progress Notes (Unsigned)
  Patient Name: Nancy [name] Date of Birth: 01-04-1996 [place] Prenatal Visit  Swahili interpreter *** used for entirety of visit.   Nancy [name] is a 26 y.o. G2P1001 at [redacted]w[redacted]d here for routine prenatal visit. She needs early 1-hour glucola and Pap smear today. She is dated by early ultrasound.  She reports {symptoms; pregnancy related:14538}.  She denies vaginal bleeding.  See flow sheet for details.  There were no vitals filed for this visit. ***   A/P: Pregnancy at [redacted]w[redacted]d.  Doing well.    Routine Prenatal Care:  Dating reviewed, dating tab is {correct:23336::"correct"} Fetal heart tones {appropriate:23337} Influenza vaccine {given:23340}  COVID vaccination was discussed and ***.  The patient has the following indication for screening preexisting diabetes: BMI > 25 and high risk ethnicity (Latino, [place] American, Native American, [place], Asian Optometrist) . Anatomy ultrasound ordered to be scheduled at 18-20 weeks. Patient is not interested in genetic screening.  Pregnancy education including expected weight gain in pregnancy, OTC medication use, continued use of prenatal vitamin, smoking cessation if applicable, and nutrition in pregnancy.   Bleeding and pain precautions reviewed. The patient has the following indications for aspirinto begin 81 mg at 12-16 weeks: One high risk condition: no single high risk condition  MORE than one moderate risk condition: low SES   and identifies as African American  *** does she tech meet this tho? Aspirin was  recommended today based upon above risk factors (one high risk condition or more than one moderate risk factor)   2. Pregnancy issues include the following and were addressed as appropriate today:  Anemia: on iron supplementations Continue 81 mg ASA for pre-eclampsia ppx  1-hour GTT today *** Pap smear today without routine co-testing  F/u in 4 weeks for next prenatal exam Problem list  and pregnancy  box updated: {yes/no:20286::"Yes"}.   Follow up 4 weeks.

## 2022-08-08 ENCOUNTER — Other Ambulatory Visit (HOSPITAL_COMMUNITY)
Admission: RE | Admit: 2022-08-08 | Discharge: 2022-08-08 | Disposition: A | Discharge status: Home / Self Care | Payer: Medicaid Other | Source: Ambulatory Visit | Attending: Family Medicine | Admitting: Family Medicine

## 2022-08-08 ENCOUNTER — Ambulatory Visit (INDEPENDENT_AMBULATORY_CARE_PROVIDER_SITE_OTHER): Payer: Medicaid Other | Admitting: Family Medicine

## 2022-08-08 VITALS — BP 105/70 | HR 69 | Temp 96.8°F | Wt 130.8 lb

## 2022-08-08 DIAGNOSIS — Z124 Encounter for screening for malignant neoplasm of cervix: Secondary | ICD-10-CM | POA: Diagnosis present

## 2022-08-08 DIAGNOSIS — O26892 Other specified pregnancy related conditions, second trimester: Secondary | ICD-10-CM | POA: Diagnosis present

## 2022-08-08 DIAGNOSIS — Z3A22 22 weeks gestation of pregnancy: Secondary | ICD-10-CM | POA: Diagnosis present

## 2022-08-08 DIAGNOSIS — N898 Other specified noninflammatory disorders of vagina: Secondary | ICD-10-CM | POA: Diagnosis not present

## 2022-08-08 DIAGNOSIS — O99011 Anemia complicating pregnancy, first trimester: Secondary | ICD-10-CM | POA: Diagnosis not present

## 2022-08-08 DIAGNOSIS — D649 Anemia, unspecified: Secondary | ICD-10-CM | POA: Diagnosis not present

## 2022-08-08 DIAGNOSIS — Z23 Encounter for immunization: Secondary | ICD-10-CM | POA: Diagnosis not present

## 2022-08-08 DIAGNOSIS — Z3482 Encounter for supervision of other normal pregnancy, second trimester: Secondary | ICD-10-CM

## 2022-08-08 DIAGNOSIS — O99012 Anemia complicating pregnancy, second trimester: Secondary | ICD-10-CM

## 2022-08-08 LAB — POCT WET PREP (WET MOUNT)
Clue Cells Wet Prep Whiff POC: NEGATIVE
Trichomonas Wet Prep HPF POC: ABSENT
WBC, Wet Prep HPF POC: >20

## 2022-08-08 MED ORDER — MICONAZOLE 7 100 MG VA SUPP: 100.0000 mg | Freq: Every day | VAGINAL | 0 refills | Status: DC

## 2022-08-08 MED ORDER — FERROUS SULFATE 325 (65 FE) MG PO TABS: 325.0000 mg | ORAL_TABLET | ORAL | 3 refills | Status: DC

## 2022-08-08 NOTE — Patient Instructions (Addendum)
Ilikuwa nzuri [employer].  Carman Ching juu ya:  Ardelia Mems ultrasound ya anatomia iliyoratibiwa KESHO, 08/09/2022 saa 11:30 AM -Leretha Pol na vitamini vyako vya ujauzito, nyongeza ya madini ya chuma na aspirini -[place] Pap smear leo na kipimo cha sukari kwa saa 1 ili kuangalia ugonjwa wa kisukari uliokuwepo -Miadi yako ijayo itakuwa baada ya mwezi mmoja na wasimamizi wetu wa kitivo.  [employer].  [place] piga simu [phone] na maswali yoyote kuhusu miadi ya leo.  Tafadhali [id number] umepanga ufuatiliaji kwenye dawati la mbele kabla ya [place] leo.  Sharion Settler, DO [place] PGY-3  Iwapo utapata damu yoyote [place], kuvuja kwa viowevu, huhisi mtoto wako [place], au unaanza kusinyaa kwa [place] wa chini ya [place] 5 tafadhali nenda moja kwa moja [place] cha [place] ya Uzazi katika Hospitali ya [place] kwa tathmini.  Huduma za uzazi na utunzaji wa [place] ziko upande wa kusini wa [facility] [place]. [facility] (Entrance C off [address] [place], [place] [address]   It was wonderful to see you today.  Today we talked about:  -You have an anatomy ultrasound scheduled TOMORROW, 08/09/2022 at 11:30 AM -Continue your prenatal vitamins, iron supplement and aspirin -We did your Pap smear today and 1-hour sugar test to check for pre-existing diabetes -Your next appointment will be in a month with our faculty supervisors.   Thank you for choosing [facility].   Please call [phone] with any questions about today's appointment.  Please be sure to schedule follow up at the front  desk before you leave today.   Sharion Settler, DO PGY-3 Family Medicine    If you experience any vaginal bleeding, leakage of fluids, don't feel your baby moving as much, or start to have contractions less than 5 minutes apart please go directly to the Maternal  Assessment Unit at [facility] for evaluation.  Maternity and women's care services located on the [place] side of The [place]. [facility] (Entrance C off [address]).  [address] [place],  [place]  [address]

## 2022-08-08 NOTE — Assessment & Plan Note (Signed)
Wet prep shows **.

## 2022-08-09 ENCOUNTER — Ambulatory Visit: Payer: Medicaid Other

## 2022-08-09 LAB — GLUCOSE TOLERANCE, 1 HOUR: Glucose, 1Hr PP: 113 mg/dL (ref 70–199)

## 2022-08-10 LAB — CYTOLOGY - PAP
Chlamydia: NEGATIVE
Comment: NEGATIVE
Comment: NORMAL
Diagnosis: NEGATIVE
Neisseria Gonorrhea: NEGATIVE

## 2022-09-05 ENCOUNTER — Other Ambulatory Visit: Payer: Self-pay | Admitting: *Deleted

## 2022-09-05 ENCOUNTER — Ambulatory Visit: Payer: Medicaid Other | Attending: Family Medicine

## 2022-09-05 DIAGNOSIS — O99012 Anemia complicating pregnancy, second trimester: Secondary | ICD-10-CM

## 2022-09-05 DIAGNOSIS — Z3482 Encounter for supervision of other normal pregnancy, second trimester: Secondary | ICD-10-CM | POA: Diagnosis present

## 2022-09-05 DIAGNOSIS — Z363 Encounter for antenatal screening for malformations: Secondary | ICD-10-CM | POA: Insufficient documentation

## 2022-09-05 DIAGNOSIS — Z362 Encounter for other antenatal screening follow-up: Secondary | ICD-10-CM

## 2022-09-05 DIAGNOSIS — Z3A27 27 weeks gestation of pregnancy: Secondary | ICD-10-CM | POA: Insufficient documentation

## 2022-09-07 ENCOUNTER — Telehealth: Payer: Self-pay | Admitting: Family Medicine

## 2022-09-07 NOTE — Telephone Encounter (Signed)
Patient did not arrive for prenatal appointment. Called twice with interpreter. Did not answer and did not have VM set up.

## 2022-10-05 ENCOUNTER — Ambulatory Visit: Payer: Medicaid Other | Attending: Maternal & Fetal Medicine

## 2022-10-05 ENCOUNTER — Ambulatory Visit: Payer: Medicaid Other | Admitting: *Deleted

## 2022-10-05 VITALS — BP 107/73 | HR 78

## 2022-10-05 DIAGNOSIS — O99013 Anemia complicating pregnancy, third trimester: Secondary | ICD-10-CM

## 2022-10-05 DIAGNOSIS — Z3689 Encounter for other specified antenatal screening: Secondary | ICD-10-CM | POA: Insufficient documentation

## 2022-10-05 DIAGNOSIS — D649 Anemia, unspecified: Secondary | ICD-10-CM | POA: Diagnosis not present

## 2022-10-05 DIAGNOSIS — Z3A31 31 weeks gestation of pregnancy: Secondary | ICD-10-CM

## 2022-10-05 DIAGNOSIS — O99012 Anemia complicating pregnancy, second trimester: Secondary | ICD-10-CM | POA: Diagnosis present

## 2022-10-05 DIAGNOSIS — Z362 Encounter for other antenatal screening follow-up: Secondary | ICD-10-CM | POA: Diagnosis present

## 2022-10-06 ENCOUNTER — Ambulatory Visit: Payer: Medicaid Other

## 2022-10-08 ENCOUNTER — Emergency Department (HOSPITAL_COMMUNITY)
Admission: EM | Admit: 2022-10-08 | Discharge: 2022-10-09 | Disposition: A | Discharge status: Home / Self Care | Payer: Medicaid Other | Attending: Emergency Medicine | Admitting: Emergency Medicine

## 2022-10-08 ENCOUNTER — Other Ambulatory Visit: Payer: Self-pay

## 2022-10-08 ENCOUNTER — Encounter (HOSPITAL_COMMUNITY): Payer: Self-pay | Admitting: Emergency Medicine

## 2022-10-08 DIAGNOSIS — Z7982 Long term (current) use of aspirin: Secondary | ICD-10-CM | POA: Diagnosis not present

## 2022-10-08 DIAGNOSIS — O26893 Other specified pregnancy related conditions, third trimester: Secondary | ICD-10-CM | POA: Diagnosis present

## 2022-10-08 DIAGNOSIS — Z3A33 33 weeks gestation of pregnancy: Secondary | ICD-10-CM | POA: Diagnosis not present

## 2022-10-08 DIAGNOSIS — K0889 Other specified disorders of teeth and supporting structures: Secondary | ICD-10-CM | POA: Diagnosis not present

## 2022-10-08 NOTE — ED Triage Notes (Signed)
Pt reported to ED with c/o rt lower dental pain x2-3 days.

## 2022-10-08 NOTE — ED Provider Triage Note (Signed)
Emergency Medicine Provider Triage Evaluation Note  Nancy [name] , [name] 26 y.o. female  was evaluated in triage.  Pt complains of right lower dental pain onset 2-3 days.  Does not have [name] dentist at this time.  No meds tried prior to arrival.  Denies drainage, trouble swallowing, trouble breathing.  Review of Systems  Positive:  Negative:   Physical Exam  BP 121/77 (BP Location: Right Arm)   Pulse 84   Temp 98.7 F (37.1 C)   Resp 15   LMP 03/09/2022 (Exact Date)   SpO2 99%  Gen:   Awake, no distress   Resp:  Normal effort  MSK:   Moves extremities without difficulty  Other:    Medical Decision Making  Medically screening exam initiated at 11:28 PM.  Appropriate orders placed.  Nancy [name] was informed that the remainder of the evaluation will be completed by another provider, this initial triage assessment does not replace that evaluation, and the importance of remaining in the ED until their evaluation is complete.     Nancy Rohm A, PA-C 10/08/22 2328

## 2022-10-09 MED ORDER — PENICILLIN V POTASSIUM 500 MG PO TABS: 500.0000 mg | ORAL_TABLET | Freq: Four times a day (QID) | ORAL | 0 refills | Status: AC

## 2022-10-09 MED ORDER — ACETAMINOPHEN 500 MG PO TABS
1000.0000 mg | ORAL_TABLET | Freq: Four times a day (QID) | ORAL | Status: DC | PRN
  Administered 2022-10-09: 1000 mg via ORAL
  Filled 2022-10-09: qty 2

## 2022-10-09 NOTE — ED Provider Notes (Signed)
[facility] EMERGENCY DEPARTMENT Provider Note   CSN: [id number] Arrival date & time: 10/08/22  2257     History  Chief Complaint  Patient presents with   Dental Pain    Nancy [name] is a 26 y.o. female.  HPI   Medical history including asthma, [redacted] weeks pregnant presenting with complaints of dental pain, started out 2 days ago, pain is her right bottom jaw, remains constant, no tongue throat lip swelling difficulty breathing, no fevers no chills, has not seen a dentist, denies any trauma, she has no other complaints.  Reviewed patient's chart she has been followed by her primary care doctor for her prenatal care.  Home Medications Prior to Admission medications   Medication Sig Start Date End Date Taking? Authorizing Provider  penicillin v potassium (VEETID) 500 MG tablet Take 1 tablet (500 mg total) by mouth 4 (four) times daily for 7 days. 10/09/22 10/16/22 Yes Carroll Sage, PA-C  aspirin EC 81 MG tablet Take 1 tablet (81 mg total) by mouth daily. Swallow whole. Patient not taking: Reported on 10/05/2022 07/07/22   Reece Leader, DO  ferrous sulfate 325 (65 FE) MG tablet Take 1 tablet (325 mg total) by mouth every other day. Patient not taking: Reported on 08/08/2022 08/08/22   Sabino Dick, DO  miconazole (MICONAZOLE 7) 100 MG vaginal suppository Place 1 suppository (100 mg total) vaginally at bedtime. 08/08/22   Sabino Dick, DO  Prenatal Vit-Fe Fumarate-FA (PRENATAL MULTIVITAMIN) TABS tablet Take 1 tablet by mouth daily. 05/02/22   Sabino Dick, DO      Allergies    Patient has no known allergies.    Review of Systems   Review of Systems  Constitutional:  Negative for chills and fever.  HENT:  Positive for dental problem.   Respiratory:  Negative for shortness of breath.   Cardiovascular:  Negative for chest pain.  Gastrointestinal:  Negative for abdominal pain.  Neurological:  Negative for headaches.    Physical  Exam Updated Vital Signs BP 121/77 (BP Location: Right Arm)   Pulse 84   Temp 98.7 F (37.1 C)   Resp 15   LMP 03/09/2022 (Exact Date)   SpO2 99%  Physical Exam Vitals and nursing note reviewed.  Constitutional:      General: She is not in acute distress.    Appearance: She is not ill-appearing.  HENT:     Head: Normocephalic and atraumatic.     Nose: No congestion.     Mouth/Throat:     Mouth: Mucous membranes are moist.     Pharynx: Oropharynx is clear. No oropharyngeal exudate or posterior oropharyngeal erythema.     Comments: No trismus no torticollis, no oral trauma present, tongue uvula midline controlling oral secretions tonsils are both equal symmetric bilaterally, no submandibular swelling, no obvious dental cavities present during my exam but along the right bottom gumline around the molars there is some erythema, no palpable fluctuant induration present. Eyes:     Extraocular Movements: Extraocular movements intact.     Conjunctiva/sclera: Conjunctivae normal.  Cardiovascular:     Rate and Rhythm: Normal rate and regular rhythm.     Pulses: Normal pulses.     Heart sounds: No murmur heard.    No friction rub. No gallop.  Pulmonary:     Effort: No respiratory distress.     Breath sounds: No wheezing, rhonchi or rales.  Skin:    General: Skin is warm and dry.  Neurological:  Mental Status: She is alert.  Psychiatric:        Mood and Affect: Mood normal.     ED Results / Procedures / Treatments   Labs (all labs ordered are listed, but only abnormal results are displayed) Labs Reviewed - No data to display  EKG None  Radiology No results found.  Procedures Procedures    Medications Ordered in ED Medications  acetaminophen (TYLENOL) tablet 1,000 mg (1,000 mg Oral Given 10/09/22 0155)    ED Course/ Medical Decision Making/ A&P                           Medical Decision Making Risk OTC drugs.   This patient presents to the ED for concern of  dental pain, this involves an extensive number of treatment options, and is a complaint that carries with it a high risk of complications and morbidity.  The differential diagnosis includes periorbital/orbital cellulitis, retropharyngeal/peritonsillar abscess,    Additional history obtained:  Additional history obtained from N/A External records from outside source obtained and reviewed including N/A   Co morbidities that complicate the patient evaluation  [redacted] weeks pregnant  Social Determinants of Health:  None English speaking    Lab Tests:  I Ordered, and personally interpreted labs.  The pertinent results include: N/A   Imaging Studies ordered:  I ordered imaging studies including N/A I independently visualized and interpreted imaging which showed N/A I agree with the radiologist interpretation   Cardiac Monitoring:  The patient was maintained on a cardiac monitor.  I personally viewed and interpreted the cardiac monitored which showed an underlying rhythm of: N/A   Medicines ordered and prescription drug management:  I ordered medication including Tylenol I have reviewed the patients home medicines and have made adjustments as needed  Critical Interventions:  N/A   Reevaluation:  Presents with dull pain, benign physical exam, agreement with discharge at this time  Consultations Obtained:  N/A    Test Considered:  N/A    Rule out I have low suspicion for peritonsillar abscess, retropharyngeal abscess, or Ludwig angina as oropharynx was visualized tongue and uvula were both midline, there is no exudates, erythema or edema noted in the posterior pillars or on/ around tonsils.  Low suspicion for an abscess as gumline were palpated no fluctuance or induration felt.  Low suspicion for periorbital or orbital cellulitis as patient face had no erythematous, patient EOMs were intact, he had no pain with eye movement.  Doubt threatened pregnancy not endorsing  any OB/GYN complaints at this time.     Dispostion and problem list  After consideration of the diagnostic results and the patients response to treatment, I feel that the patent would benefit from discharge.  Dental pain-possible from dental cavity, will start antibiotics and have her follow-up with a dentist for further evaluation and strict return precautions.            Final Clinical Impression(s) / ED Diagnoses Final diagnoses:  Pain, dental    Rx / DC Orders ED Discharge Orders          Ordered    penicillin v potassium (VEETID) 500 MG tablet  4 times daily        10/09/22 0157              Marcello Fennel, PA-C [id number]    Delora Fuel, MD [id number] [phone]

## 2022-10-09 NOTE — Discharge Instructions (Signed)
Possible you might have a dental cavity, have started you on antibiotics please take as prescribed, I would like you to start taking Tylenol every 6 as needed for pain, please do not take any NSAIDs as you are pregnant.  Please follow-up with dentist for further evaluation  Come back to the emergency department if you develop chest pain, shortness of breath, severe abdominal pain, uncontrolled nausea, vomiting, diarrhea.

## 2022-11-20 NOTE — L&D Delivery Note (Signed)
OB/GYN Faculty Practice Delivery Note  Nancy [name] is a 27 y.o. G2P1001 s/p SVD at [redacted]w[redacted]d. She was admitted for SOL.   ROM: 5h 75m with clear fluid GBS Status:  NEGATIVE/-- (01/03 1415) Maximum Maternal Temperature: 98.10F  Labor Progress: Initial SVE: 2/80/posterior. She then progressed to complete.   Delivery Date/Time: 11/22/22 2327 Delivery: Called to room and patient was complete and pushing. Head delivered LOA. No nuchal cord present. Compound R hand. Shoulder and body delivered in usual fashion. Infant with spontaneous cry, placed on mother's abdomen, dried and stimulated. Cord clamped x 2 after 1-minute delay, and cut by FOB. Cord blood drawn. Placenta delivered spontaneously with gentle cord traction. Fundus firm with massage and Pitocin. Labia, perineum, vagina, and cervix inspected with 1st degree laceration noted, repaired in usual fashion.  Baby Weight: pending  Placenta: 3 vessel, intact. Sent to L&D Complications: None Lacerations: 1st degree laceration noted, repaired in usual fashion EBL: 82  mL Analgesia: Epidural   Infant:  APGAR (1 MIN): 7   APGAR (5 MINS): [place], DO OB Family Medicine Fellow, [facility] for [employer], [employer] 11/22/2022, 11:51 PM

## 2022-11-22 ENCOUNTER — Encounter (HOSPITAL_COMMUNITY): Payer: Self-pay | Admitting: Obstetrics and Gynecology

## 2022-11-22 ENCOUNTER — Inpatient Hospital Stay (HOSPITAL_COMMUNITY): Payer: Medicaid Other | Admitting: Anesthesiology

## 2022-11-22 ENCOUNTER — Inpatient Hospital Stay (HOSPITAL_COMMUNITY)
Admission: AD | Admit: 2022-11-22 | Discharge: 2022-11-24 | DRG: 807 | Disposition: A | Discharge status: Home / Self Care | Payer: Medicaid Other | Attending: Obstetrics and Gynecology | Admitting: Obstetrics and Gynecology

## 2022-11-22 DIAGNOSIS — O0933 Supervision of pregnancy with insufficient antenatal care, third trimester: Secondary | ICD-10-CM

## 2022-11-22 DIAGNOSIS — M7989 Other specified soft tissue disorders: Secondary | ICD-10-CM | POA: Diagnosis not present

## 2022-11-22 DIAGNOSIS — O0930 Supervision of pregnancy with insufficient antenatal care, unspecified trimester: Secondary | ICD-10-CM

## 2022-11-22 DIAGNOSIS — O26893 Other specified pregnancy related conditions, third trimester: Secondary | ICD-10-CM | POA: Diagnosis present

## 2022-11-22 DIAGNOSIS — F53 Postpartum depression: Secondary | ICD-10-CM | POA: Diagnosis not present

## 2022-11-22 DIAGNOSIS — Z3A38 38 weeks gestation of pregnancy: Secondary | ICD-10-CM | POA: Diagnosis not present

## 2022-11-22 DIAGNOSIS — O326XX1 Maternal care for compound presentation, fetus 1: Secondary | ICD-10-CM

## 2022-11-22 DIAGNOSIS — K0510 Chronic gingivitis, plaque induced: Secondary | ICD-10-CM | POA: Diagnosis present

## 2022-11-22 LAB — CBC
HCT: 38.6 % (ref 36.0–46.0)
Hemoglobin: 12.4 g/dL (ref 12.0–15.0)
MCH: 29.2 pg (ref 26.0–34.0)
MCHC: 32.1 g/dL (ref 30.0–36.0)
MCV: 91 fL (ref 80.0–100.0)
Platelets: 158 10*3/uL (ref 150–400)
RBC: 4.24 MIL/uL (ref 3.87–5.11)
RDW: 14.2 % (ref 11.5–15.5)
WBC: 7.7 10*3/uL (ref 4.0–10.5)
nRBC: 0 % (ref 0.0–0.2)

## 2022-11-22 LAB — TYPE AND SCREEN
ABO/RH(D): O POS
Antibody Screen: NEGATIVE

## 2022-11-22 LAB — GROUP B STREP BY PCR: Group B strep by PCR: NEGATIVE

## 2022-11-22 LAB — HEMOGLOBIN A1C
Hgb A1c MFr Bld: 4.8 % (ref 4.8–5.6)
Mean Plasma Glucose: 91 mg/dL

## 2022-11-22 MED ORDER — ONDANSETRON HCL 4 MG/2ML IJ SOLN: 4.0000 mg | Freq: Four times a day (QID) | INTRAMUSCULAR | Status: DC | PRN

## 2022-11-22 MED ORDER — OXYTOCIN BOLUS FROM INFUSION
333.0000 mL | Freq: Once | INTRAVENOUS | Status: AC
  Administered 2022-11-22: 333 mL via INTRAVENOUS

## 2022-11-22 MED ORDER — OXYCODONE-ACETAMINOPHEN 5-325 MG PO TABS: 1.0000 | ORAL_TABLET | ORAL | Status: DC | PRN

## 2022-11-22 MED ORDER — LACTATED RINGERS IV SOLN: INTRAVENOUS | Status: DC

## 2022-11-22 MED ORDER — EPHEDRINE 5 MG/ML INJ: 10.0000 mg | INTRAVENOUS | Status: DC | PRN

## 2022-11-22 MED ORDER — PHENYLEPHRINE 80 MCG/ML (10ML) SYRINGE FOR IV PUSH (FOR BLOOD PRESSURE SUPPORT): 80.0000 ug | PREFILLED_SYRINGE | INTRAVENOUS | Status: DC | PRN

## 2022-11-22 MED ORDER — DIPHENHYDRAMINE HCL 50 MG/ML IJ SOLN: 12.5000 mg | INTRAMUSCULAR | Status: DC | PRN

## 2022-11-22 MED ORDER — FENTANYL-BUPIVACAINE-NACL 0.5-0.125-0.9 MG/250ML-% EP SOLN
12.0000 mL/h | EPIDURAL | Status: DC | PRN
  Administered 2022-11-22: 12 mL/h via EPIDURAL
  Filled 2022-11-22: qty 250

## 2022-11-22 MED ORDER — LIDOCAINE-EPINEPHRINE (PF) 2 %-1:200000 IJ SOLN
INTRAMUSCULAR | Status: DC | PRN
  Administered 2022-11-22: 3 mL via EPIDURAL

## 2022-11-22 MED ORDER — BUPIVACAINE HCL (PF) 0.25 % IJ SOLN
INTRAMUSCULAR | Status: DC | PRN
  Administered 2022-11-22 (×2): 3 mL via EPIDURAL

## 2022-11-22 MED ORDER — SOD CITRATE-CITRIC ACID 500-334 MG/5ML PO SOLN: 30.0000 mL | ORAL | Status: DC | PRN

## 2022-11-22 MED ORDER — LACTATED RINGERS IV SOLN
500.0000 mL | Freq: Once | INTRAVENOUS | Status: AC
  Administered 2022-11-22: 500 mL via INTRAVENOUS

## 2022-11-22 MED ORDER — LACTATED RINGERS IV SOLN: 500.0000 mL | INTRAVENOUS | Status: DC | PRN

## 2022-11-22 MED ORDER — ACETAMINOPHEN 325 MG PO TABS
650.0000 mg | ORAL_TABLET | ORAL | Status: DC | PRN
  Administered 2022-11-22: 650 mg via ORAL
  Filled 2022-11-22: qty 2

## 2022-11-22 MED ORDER — LACTATED RINGERS IV BOLUS
1000.0000 mL | Freq: Once | INTRAVENOUS | Status: AC
Start: 2022-11-22 — End: 2022-11-22
  Administered 2022-11-22: 1000 mL via INTRAVENOUS

## 2022-11-22 MED ORDER — OXYTOCIN-SODIUM CHLORIDE 30-0.9 UT/500ML-% IV SOLN
2.5000 [IU]/h | INTRAVENOUS | Status: DC
  Filled 2022-11-22: qty 500

## 2022-11-22 MED ORDER — OXYCODONE-ACETAMINOPHEN 5-325 MG PO TABS: 2.0000 | ORAL_TABLET | ORAL | Status: DC | PRN

## 2022-11-22 MED ORDER — LIDOCAINE HCL (PF) 1 % IJ SOLN: 30.0000 mL | INTRAMUSCULAR | Status: DC | PRN

## 2022-11-22 NOTE — Progress Notes (Signed)
Pericare conducted with patient. Large discolored area noted to left upper hip that appears to be a large bruise. Unable to ask patient at this time due to spouse remaining in room. Derrill Memo (assigned provider) notified and will assess area.
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
## 2022-11-22 NOTE — Anesthesia Procedure Notes (Addendum)
Epidural Patient location during procedure: OB Start time: 11/22/2022 3:45 PM End time: 11/22/2022 4:11 PM  Staffing Anesthesiologist: Oleta Mouse, MD Performed: anesthesiologist   Preanesthetic Checklist Completed: patient identified, IV checked, risks and benefits discussed, monitors and equipment checked, pre-op evaluation and timeout performed  Epidural Patient position: sitting Prep: DuraPrep Patient monitoring: heart rate, continuous pulse ox and blood pressure Approach: midline Location: L4-L5 Injection technique: LOR saline  Needle:  Needle type: Tuohy  Needle gauge: 17 G Needle length: 9 cm Needle insertion depth: 8 cm Catheter type: closed end flexible Catheter size: 19 Gauge Catheter at skin depth: 15 cm Test dose: negative and 2% lidocaine with Epi 1:200 K  Assessment Events: blood not aspirated, no cerebrospinal fluid, injection not painful, no injection resistance, no paresthesia and negative IV test

## 2022-11-22 NOTE — MAU Note (Signed)
.  Nancy [name] is a 27 y.o. at [redacted]w[redacted]d here in MAU reporting: Pt reports abdominal pain that comes and goes.  Denies vaginla bleeding  Denies LOF Reports +FM Onset of complaint: 7AM Pain score: 10/10 ctx There were no vitals filed for this visit.    Lab orders placed from triage:   none

## 2022-11-22 NOTE — MAU Note (Signed)
Pt informed that the ultrasound is considered a limited OB ultrasound and is not intended to be a complete ultrasound exam.  Patient also informed that the ultrasound is not being completed with the intent of assessing for fetal or placental anomalies or any pelvic abnormalities.  Explained that the purpose of today's ultrasound is to assess for  presentation.  Patient acknowledges the purpose of the exam and the limitations of the study.     Pt is vertex.  

## 2022-11-22 NOTE — Progress Notes (Signed)
Nancy [name] is a 27 y.o. G2P1001 at [redacted]w[redacted]d by ultrasound admitted for active labor  Subjective: Pt comfortable with epidural. Husband at bedside for support.  Objective: BP 122/65 (BP Location: Right Arm)   Pulse 80   Temp 98.1 F (36.7 C)   Resp 20   LMP 03/09/2022 (Exact Date)   SpO2 100%  No intake/output data recorded. Total I/O In: 1498.8 [I.V.:499.8; IV Piggyback:999] Out: -   FHT:  FHR: 135 bpm, variability: moderate,  accelerations: 10 x 10 but not 15 x 15  ,  decelerations:  Present early UC:   regular, every 2-3 minutes SVE:   Dilation: 4.5 Effacement (%): 80 Exam by:: Wallie Renshaw RN  Labs: Lab Results  Component Value Date   WBC 7.7 11/22/2022   HGB 12.4 11/22/2022   HCT 38.6 11/22/2022   MCV 91.0 11/22/2022   PLT 158 11/22/2022    Assessment / Plan: Spontaneous labor, progressing normally  Labor:  Pt comfortable with epidural and cervix checked by RN ~ 30 minutes prior to CNM to room.  Progress from 2 cm on arrival in MAU to 4.5 on recent exam. Will continue to observe and recheck in 2-3 hours or sooner if pt reports increased pressure or for fetal indications. Preeclampsia:   n/a Fetal Wellbeing:  Category I Pain Control:  Epidural I/D:   GBS unknown--PCR negative Anticipated MOD:  NSVD  Fatima Blank, CNM 11/22/2022, 5:13 PM
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
## 2022-11-22 NOTE — Progress Notes (Signed)
Spouse asked to leave room for epidural placement. Interpreter utilized and patient asked if she feels safe from harm for herself and infant. Patient initially hesitates but states yes. CNM notified of patient's response to yes but will put in a Social Work consult.

## 2022-11-22 NOTE — Progress Notes (Signed)
Labor Progress Note  Nancy [name] is a 27 y.o. G2P1001 at [redacted]w[redacted]d presented for SOL  S: Pt feels more pressure, noted late decels.   O:  BP (!) 110/54 (BP Location: Left Arm)   Pulse 83   Temp 98.4 F (36.9 C) (Oral)   Resp 17   LMP 03/09/2022 (Exact Date)   SpO2 99%  EFM: 145 bpm/Moderate variability/ 15x15 accels/ Late decels  CVE: Dilation: Lip/rim Effacement (%): 90 Station: Plus 1 Exam by:: Simmie Davies DO   A&P: 27 y.o. G2P1001 [redacted]w[redacted]d  here for SOL as above  #Labor: Progressing well. S/p SROM. SVE show progression. Contraction couplets, concern for OP positioning of fetal head. IVB and maternal position changes show improvement of FHT #Pain: Epidural #FWB: CAT 2 #GBS negative  Shelda Pal, DO [place] Fellow, Faculty practice [place] [facility] [place] 11/22/22  10:54 PM

## 2022-11-22 NOTE — Plan of Care (Signed)

## 2022-11-22 NOTE — Progress Notes (Signed)
Labor Progress Note  Kynleigh Artz is a 27 y.o. G2P1001 at [redacted]w[redacted]d presented for SOL  S: Pt pleasant and notes relief from contraction pains. She had eaten a bit of food. She now understands she cannot eat while on an epidural  O:  BP (!) 115/57   Pulse 74   Temp 98.5 F (36.9 C) (Oral)   Resp 17   LMP 03/09/2022 (Exact Date)   SpO2 99%  EFM: 135 bpm/Moderate variability/ 15x15 accels/ Late decels  CVE: Dilation: 8 Effacement (%): 90 Station: 0 Exam by:: Wallie Renshaw RN   A&P: 27 y.o. G2P1001 [redacted]w[redacted]d  here for SOL as above  #Labor: Progressing well. S/p SROM. Contracting on her own. Noted some late decels, minimal variability that improved w/ IVB and position changes #Pain: Epidural #FWB: CAT 2 #GBS negative  Shelda Pal, DO [place] Fellow, Faculty practice [place] [facility] [place] 11/22/22  8:46 PM

## 2022-11-22 NOTE — Progress Notes (Signed)
Interpreters utilized during admission, patient teaching, plan of care for labor and delivery, epidural placement and the CNM bedside assessment.  Mohamed #[id number] Josph Macho #[id number] Darel Hong #[id number]

## 2022-11-22 NOTE — Discharge Summary (Signed)
Postpartum Discharge Summary    Patient Name: Nancy [name] DOB: 10-30-1996 MRN: [id number]  Date of admission: 11/22/2022 Delivery date:11/22/2022  Delivering provider: Shelda Pal  Date of discharge: 11/25/2022  Admitting diagnosis: Normal labor [O80, Z37.9] Intrauterine pregnancy: [redacted]w[redacted]d    Secondary diagnosis:  Principal Problem:   Vaginal delivery Active Problems:   Limited prenatal care   Gingivitis due to dental plaque  Additional problems: none    Discharge diagnosis: Term Pregnancy Delivered                                              Post partum procedures: Augmentation: N/A Complications: None  Hospital course: Onset of Labor With Vaginal Delivery      27y.o. yo G2P1001 at 348w1das admitted in Latent Labor on 11/22/2022. Labor course was complicated bynone.  Membrane Rupture Time/Date: 5:30 PM ,11/22/2022   Delivery Method:Vaginal, Spontaneous  Episiotomy: None  Lacerations:  Perineal;1st degree  Patient had a postpartum course complicated by pain and swelling in the calf of her right lower extremity.  A Doppler ultrasound was ordered and preliminary report was noted to be negative for DVT.  She started to complain about a tooth ache.  Examination showed erythema, with mild swelling right lower premolar.  No obvious abscess.  She is being sent home on a prescription for antibiotics and some pain medicine.  I have strongly recommended that she calls the health department to find a dentist recommendation and follows up with the dentist for definitive management. She is ambulating, tolerating a regular diet, passing flatus, and urinating well. Patient is discharged home in stable condition on 11/25/22.  Newborn Data: Birth date:11/22/2022  Birth time:11:27 PM  Gender:Female  Living status:Living  Apgars:7 ,8  Weight:3120 g   Magnesium Sulfate received: No BMZ received: No Rhophylac:N/A MMR:N/A T-DaP: offered postpartum Flu: No Transfusion:No  Physical  exam  Vitals:   11/23/22 1430 11/23/22 2200 11/24/22 0530 11/24/22 1559  BP: 115/62 124/77 128/81 134/82  Pulse: 72 78 79 85  Resp: _0 Temp: 98.8 F (37.1 C) 97.9 F (36.6 C) 98.3 F (36.8 C) 98 F (36.7 C)  TempSrc: Oral Oral Oral Oral  SpO2: 100% 100% 100% 100%   General: alert, cooperative, and no distress Mouth: Pink and moist.  Erythema, with mild swelling noted to gingiva of right lower second premolar.  No obvious abscess formation noted.  Pain with percussion over the tooth. Lochia: appropriate Uterine Fundus: firm Incision: N/A DVT Evaluation: b/l mild LE swelling, however calf pain noted to RLE. Labs: Lab Results  Component Value Date   WBC 11.0 (H) 11/23/2022   HGB 11.5 (L) 11/23/2022   HCT 34.3 (L) 11/23/2022   MCV 89.6 11/23/2022   PLT 148 (L) 11/23/2022      Latest Ref Rng & Units 05/20/2022    5:11 PM  CMP  Glucose 70 - 99 mg/dL 91   BUN 6 - 20 mg/dL <5   Creatinine 0.44 - 1.00 mg/dL 0.33   Sodium 135 - 145 mmol/L 135   Potassium 3.5 - 5.1 mmol/L 3.8   Chloride 98 - 111 mmol/L 106   CO2 22 - 32 mmol/L 21   Calcium 8.9 - 10.3 mg/dL 8.8   Total Protein 6.5 - 8.1 g/dL 7.1   Total Bilirubin 0.3 - 1.2 mg/dL 0.2   Alkaline  Phos 38 - 126 U/L 47   AST 15 - 41 U/L 15   ALT 0 - 44 U/L 12    Edinburgh Score:    11/24/2022    2:30 PM  Edinburgh Postnatal Depression Scale Screening Tool  I have been able to laugh and see the funny side of things. 0  I have looked forward with enjoyment to things. 0  I have blamed myself unnecessarily when things went wrong. 0  I have been anxious or worried for no good reason. 0  I have felt scared or panicky for no good reason. 0  Things have been getting on top of me. 0  I have been so unhappy that I have had difficulty sleeping. 0  I have felt sad or miserable. 0  I have been so unhappy that I have been crying. 0  The thought of harming myself has occurred to me. 1  Edinburgh Postnatal Depression Scale Total 1      After visit meds:  Allergies as of 11/24/2022   No Known Allergies      Medication List     STOP taking these medications    aspirin EC 81 MG tablet   ferrous sulfate 325 (65 FE) MG tablet   Miconazole 7 100 MG vaginal suppository Generic drug: miconazole       TAKE these medications    amoxicillin-clavulanate 875-125 MG tablet Commonly known as: AUGMENTIN Take 1 tablet by mouth every 12 (twelve) hours for 10 days.   ibuprofen 600 MG tablet Commonly known as: ADVIL Take 1 tablet (600 mg total) by mouth every 6 (six) hours.   oxyCODONE 5 MG immediate release tablet Commonly known as: Oxy IR/ROXICODONE Take 1 tablet (5 mg total) by mouth every 6 (six) hours as needed for moderate pain or severe pain.   prenatal multivitamin Tabs tablet Take 1 tablet by mouth daily.         Discharge home in stable condition Infant Feeding: Bottle and Breast Infant Disposition:home with mother Discharge instruction: per After Visit Summary and Postpartum booklet. Activity: Advance as tolerated. Pelvic rest for 6 weeks.  Diet: routine diet Future Appointments: Future Appointments  Date Time Provider [place]  12/08/2022  8:45 AM [place] [facility]  12/29/2022 10:35 AM Manya Silvas, CNM [facility]   Follow up Visit: As scheduled above.   Liliane Channel MD MPH OB Fellow, [place] for [place] 11/25/2022

## 2022-11-23 ENCOUNTER — Encounter (HOSPITAL_COMMUNITY): Payer: Self-pay | Admitting: Obstetrics and Gynecology

## 2022-11-23 LAB — RPR: RPR Ser Ql: NONREACTIVE

## 2022-11-23 LAB — CBC
HCT: 34.3 % — ABNORMAL LOW (ref 36.0–46.0)
Hemoglobin: 11.5 g/dL — ABNORMAL LOW (ref 12.0–15.0)
MCH: 30 pg (ref 26.0–34.0)
MCHC: 33.5 g/dL (ref 30.0–36.0)
MCV: 89.6 fL (ref 80.0–100.0)
Platelets: 148 10*3/uL — ABNORMAL LOW (ref 150–400)
RBC: 3.83 MIL/uL — ABNORMAL LOW (ref 3.87–5.11)
RDW: 14.3 % (ref 11.5–15.5)
WBC: 11 10*3/uL — ABNORMAL HIGH (ref 4.0–10.5)
nRBC: 0 % (ref 0.0–0.2)

## 2022-11-23 MED ORDER — SODIUM CHLORIDE 0.9% FLUSH: 3.0000 mL | Freq: Two times a day (BID) | INTRAVENOUS | Status: DC

## 2022-11-23 MED ORDER — COCONUT OIL OIL: 1.0000 | TOPICAL_OIL | Status: DC | PRN

## 2022-11-23 MED ORDER — DIBUCAINE (PERIANAL) 1 % EX OINT: 1.0000 | TOPICAL_OINTMENT | CUTANEOUS | Status: DC | PRN

## 2022-11-23 MED ORDER — ONDANSETRON HCL 4 MG/2ML IJ SOLN: 4.0000 mg | INTRAMUSCULAR | Status: DC | PRN

## 2022-11-23 MED ORDER — TETANUS-DIPHTH-ACELL PERTUSSIS 5-2.5-18.5 LF-MCG/0.5 IM SUSY: 0.5000 mL | PREFILLED_SYRINGE | Freq: Once | INTRAMUSCULAR | Status: DC

## 2022-11-23 MED ORDER — SODIUM CHLORIDE 0.9 % IV SOLN: 250.0000 mL | INTRAVENOUS | Status: DC | PRN

## 2022-11-23 MED ORDER — SENNOSIDES-DOCUSATE SODIUM 8.6-50 MG PO TABS
2.0000 | ORAL_TABLET | ORAL | Status: DC
  Administered 2022-11-23 – 2022-11-24 (×2): 2 via ORAL
  Filled 2022-11-23 (×3): qty 2

## 2022-11-23 MED ORDER — ONDANSETRON HCL 4 MG PO TABS: 4.0000 mg | ORAL_TABLET | ORAL | Status: DC | PRN

## 2022-11-23 MED ORDER — DIPHENHYDRAMINE HCL 25 MG PO CAPS: 25.0000 mg | ORAL_CAPSULE | Freq: Four times a day (QID) | ORAL | Status: DC | PRN

## 2022-11-23 MED ORDER — ZOLPIDEM TARTRATE 5 MG PO TABS: 5.0000 mg | ORAL_TABLET | Freq: Every evening | ORAL | Status: DC | PRN

## 2022-11-23 MED ORDER — SODIUM CHLORIDE 0.9% FLUSH: 3.0000 mL | INTRAVENOUS | Status: DC | PRN

## 2022-11-23 MED ORDER — BENZOCAINE-MENTHOL 20-0.5 % EX AERO
1.0000 | INHALATION_SPRAY | CUTANEOUS | Status: DC | PRN
  Filled 2022-11-23: qty 56

## 2022-11-23 MED ORDER — WITCH HAZEL-GLYCERIN EX PADS: 1.0000 | MEDICATED_PAD | CUTANEOUS | Status: DC | PRN

## 2022-11-23 MED ORDER — MEASLES, MUMPS & RUBELLA VAC IJ SOLR: 0.5000 mL | Freq: Once | INTRAMUSCULAR | Status: DC

## 2022-11-23 MED ORDER — IBUPROFEN 600 MG PO TABS
600.0000 mg | ORAL_TABLET | Freq: Four times a day (QID) | ORAL | Status: DC
  Administered 2022-11-23 – 2022-11-24 (×7): 600 mg via ORAL
  Filled 2022-11-23 (×7): qty 1

## 2022-11-23 MED ORDER — SIMETHICONE 80 MG PO CHEW: 80.0000 mg | CHEWABLE_TABLET | ORAL | Status: DC | PRN

## 2022-11-23 MED ORDER — PRENATAL MULTIVITAMIN CH
1.0000 | ORAL_TABLET | Freq: Every day | ORAL | Status: DC
  Administered 2022-11-23 – 2022-11-24 (×2): 1 via ORAL
  Filled 2022-11-23 (×2): qty 1

## 2022-11-23 MED ORDER — ACETAMINOPHEN 325 MG PO TABS
650.0000 mg | ORAL_TABLET | ORAL | Status: DC | PRN
  Administered 2022-11-23 – 2022-11-24 (×3): 650 mg via ORAL
  Filled 2022-11-23 (×3): qty 2

## 2022-11-23 NOTE — Progress Notes (Signed)
POSTPARTUM PROGRESS NOTE  Post Partum Day 1  Subjective:  Teola Felipe is a 27 y.o. [id number] s/p SVD at [redacted]w[redacted]d.  No acute events overnight.  Pt denies problems with ambulating, voiding or po intake.  She denies nausea or vomiting.  Pain is moderately controlled.  She has had flatus. She has not had bowel movement.  Lochia Moderate.   Objective: Blood pressure 124/74, pulse 85, temperature 98.5 F (36.9 C), temperature source Oral, resp. rate 16, last menstrual period 03/09/2022, SpO2 100 %, unknown if currently breastfeeding.  Physical Exam:  General: alert, cooperative and no distress Chest: no respiratory distress Heart:regular rate, distal pulses intact Abdomen: soft, nontender,  Uterine Fundus: firm, appropriately tender DVT Evaluation: No calf swelling or tenderness Extremities: No edema Skin: warm, dry  Recent Labs    11/22/22 1409 11/23/22 0542  HGB 12.4 11.5*  HCT 38.6 34.3*    Assessment/Plan: Laticha Ferrucci is a 27 y.o. [id number] s/p SVD at [redacted]w[redacted]d   SW consult pending  PPD#1 - Doing well Contraception: Undecided Feeding: Breast + Bottle Dispo: Plan for discharge: Home in stable condition   LOS: 1 day   Fleeta Emmer, Medical Student, CNM 11/23/2022, 8:08 AM

## 2022-11-23 NOTE — Lactation Note (Signed)
This note was copied from a baby's chart. Lactation Consultation Note  Patient Name: Nancy [name] Date: 11/23/2022 Reason for consult: Initial assessment;Early term 37-38.6wks Age:25 hours   P2: Early term infant at 38+1 weeks Feeding preference: Breast/formula  Swahili audio interpreter used for interpretation.  Baby was swaddled and in the bassinet when I arrived.  Mother had no questions/concerns related to breast feeding.  Mother has formula fed twice since birth and baby has consumed 20-25 mls with each feeding.  Reviewed tummy size and provided the feeding supplementation guidelines for mother.  Asked her to latch prior to giving formula for breast stimulation.  Offered to return as needed for latch assistance.  Encouraged to feed 8-12 times/24 hours or sooner if baby shows cues.  Reviewed cues.  No support person present at this time.  RN updated.   Maternal Data Has patient been taught Hand Expression?: No (Mother familar with hand expression and can see colostrum drops) Does the patient have breastfeeding experience prior to this delivery?: Yes How long did the patient breastfeed?: Almost one year  Feeding Mother's Current Feeding Choice: Breast Milk and Formula  LATCH Score                    Lactation Tools Discussed/Used    Interventions Interventions: Breast feeding basics reviewed;Education;LC Services brochure  Discharge    Consult Status Consult Status: Follow-up Date: 11/24/22 Follow-up type: In-patient    Nancy [name] 11/23/2022, 8:01 AM
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
## 2022-11-23 NOTE — Anesthesia Postprocedure Evaluation (Signed)
Anesthesia Post Note  Patient: Nancy [name]  Procedure(s) Performed: AN AD [place]     Patient location during evaluation: Mother Baby Anesthesia Type: Epidural Level of consciousness: awake, oriented and awake and alert Pain management: pain level controlled Vital Signs Assessment: post-procedure vital signs reviewed and stable Respiratory status: spontaneous breathing, respiratory function stable and nonlabored ventilation Cardiovascular status: stable Postop Assessment: no headache, adequate PO intake, able to ambulate, patient able to bend at knees and no apparent nausea or vomiting Anesthetic complications: no   No notable events documented.  Last Vitals:  Vitals:   11/23/22 0530 11/23/22 1000  BP: 124/74 112/62  Pulse: 85 88  Resp: 16 16  Temp: 36.9 C 37.4 C  SpO2:  100%    Last Pain:  Vitals:   11/23/22 1000  TempSrc: Oral  PainSc: 4    Pain Goal:                   Matheu Ploeger

## 2022-11-24 ENCOUNTER — Inpatient Hospital Stay (HOSPITAL_COMMUNITY): Payer: Medicaid Other

## 2022-11-24 ENCOUNTER — Ambulatory Visit: Payer: Self-pay

## 2022-11-24 ENCOUNTER — Other Ambulatory Visit (HOSPITAL_COMMUNITY): Payer: Self-pay

## 2022-11-24 DIAGNOSIS — F53 Postpartum depression: Secondary | ICD-10-CM

## 2022-11-24 DIAGNOSIS — K0510 Chronic gingivitis, plaque induced: Secondary | ICD-10-CM | POA: Diagnosis present

## 2022-11-24 DIAGNOSIS — M7989 Other specified soft tissue disorders: Secondary | ICD-10-CM

## 2022-11-24 MED ORDER — OXYCODONE HCL 5 MG PO TABS: 5.0000 mg | ORAL_TABLET | Freq: Four times a day (QID) | ORAL | 0 refills | Status: DC | PRN

## 2022-11-24 MED ORDER — PENICILLIN V POTASSIUM 250 MG PO TABS: 500.0000 mg | ORAL_TABLET | Freq: Four times a day (QID) | ORAL | Status: DC

## 2022-11-24 MED ORDER — OXYCODONE HCL 5 MG PO TABS
5.0000 mg | ORAL_TABLET | Freq: Four times a day (QID) | ORAL | Status: DC | PRN
  Administered 2022-11-24 (×2): 5 mg via ORAL
  Filled 2022-11-24 (×2): qty 1

## 2022-11-24 MED ORDER — AMOXICILLIN-POT CLAVULANATE 875-125 MG PO TABS
1.0000 | ORAL_TABLET | Freq: Two times a day (BID) | ORAL | Status: DC
  Administered 2022-11-24: 1 via ORAL
  Filled 2022-11-24 (×2): qty 1

## 2022-11-24 MED ORDER — IBUPROFEN 600 MG PO TABS: 600.0000 mg | ORAL_TABLET | Freq: Four times a day (QID) | ORAL | 0 refills | Status: DC

## 2022-11-24 MED ORDER — AMOXICILLIN-POT CLAVULANATE 875-125 MG PO TABS
1.0000 | ORAL_TABLET | Freq: Two times a day (BID) | ORAL | 0 refills | Status: AC
  Filled 2022-11-24: qty 20, 10d supply, fill #0

## 2022-11-24 MED ORDER — PENICILLIN V POTASSIUM 500 MG PO TABS: 500.0000 mg | ORAL_TABLET | Freq: Four times a day (QID) | ORAL | 0 refills | Status: DC

## 2022-11-24 NOTE — Lactation Note (Signed)
This note was copied from a baby's chart. Lactation Consultation Note  Patient Name: Nancy [name] WNUUV'O Date: 11/24/2022   Age:25 hours Mom is just formula feeding. Maternal Data    Feeding    LATCH Score                    Lactation Tools Discussed/Used    Interventions    Discharge    Consult Status Consult Status: Complete    Alieu Finnigan G 11/24/2022, 11:46 PM

## 2022-11-24 NOTE — Progress Notes (Signed)
Right lower ext venous  has been completed. Refer to [facility] under chart review to view preliminary results.   11/24/2022  11:38 AM Michalene Debruler, Bonnye Fava

## 2022-11-24 NOTE — Discharge Instructions (Signed)
-   Continue your prenatal vitamins especially if breastfeeding - Try to eat iron rich food. - Take over the counter tylenol (500mg ) or ibuprofen (200mg ) three times a day as needed for cramping/pain. -Please call the [place], and see if he can get some dentist recommendations from them.  It is important for you to follow-up with a dentist for your tooth ache. -I have put in some antibiotics to help with the tooth ache.  You can also take Tylenol or ibuprofen.  I have sent a few pills of oxycodone if the pain gets really bad. - follow up in the office in 2 weeks for a mood check and in 6 weeks as scheduled for your regular post partum visit. - Please come back to [facility]U if you notice persistently elevated blood pressures or you start to have a headache, that doesn't get better with medications (tylenol and ibuprofen), rest (4hrs of sleep) and drinking water.

## 2022-11-28 NOTE — BH Specialist Note (Signed)
Via Swahili interpreter, [place], [phone], Pt did not arrive to video visit and did not answer the phone; Left HIPPA-compliant message to call back Roselyn Reef from Center for [employer] at [facility] at  [phone] [facility] office).

## 2022-11-30 ENCOUNTER — Telehealth (HOSPITAL_COMMUNITY): Payer: Self-pay | Admitting: *Deleted

## 2022-11-30 NOTE — Telephone Encounter (Signed)
Left phone voicemail message.  Odis Hollingshead, RN 11-30-2022 at 9;16am

## 2022-12-08 ENCOUNTER — Ambulatory Visit: Payer: Medicaid Other | Admitting: Clinical

## 2022-12-08 DIAGNOSIS — Z91199 Patient's noncompliance with other medical treatment and regimen due to unspecified reason: Secondary | ICD-10-CM

## 2022-12-11 ENCOUNTER — Telehealth: Payer: Self-pay

## 2022-12-11 NOTE — Telephone Encounter (Addendum)
Received phone call from [place], [facility] RN regarding patient requesting to schedule appointment.   Patient has been experiencing abdominal cramping for the last two days. She describes pain as a cramping pain. She had a vaginal delivery on 11/22/22.  Reports that bleeding has stopped. She also reports one episode of elevated BP reading of 152/91. Denies headaches, blurry vision, sudden weight gain, or RUQ pain. No history of GHTN or preeclampsia.   Denies constipation, having regular bowel movements. Temp: 98.6  She has not taken any OTC medications for pain as she does not have any at home. She requests to schedule appointment for further evaluation.   Scheduled with Dr. Nita Sells for tomorrow. MAU precautions discussed.   Precepted with Dr. Andria Frames who agreed with plan for patient being seen tomorrow.   Talbot Grumbling, RN

## 2022-12-12 ENCOUNTER — Ambulatory Visit: Payer: Medicaid Other | Admitting: Family Medicine

## 2022-12-19 ENCOUNTER — Encounter: Payer: Self-pay | Admitting: Family Medicine

## 2022-12-19 ENCOUNTER — Ambulatory Visit: Payer: Medicaid Other | Admitting: Family Medicine

## 2022-12-19 ENCOUNTER — Other Ambulatory Visit: Payer: Self-pay

## 2022-12-19 VITALS — BP 116/76 | HR 70 | Ht 60.0 in

## 2022-12-19 DIAGNOSIS — R1030 Lower abdominal pain, unspecified: Secondary | ICD-10-CM | POA: Diagnosis present

## 2022-12-19 DIAGNOSIS — R319 Hematuria, unspecified: Secondary | ICD-10-CM

## 2022-12-19 LAB — POCT URINALYSIS DIP (MANUAL ENTRY)
Bilirubin, UA: NEGATIVE
Glucose, UA: NEGATIVE mg/dL
Ketones, POC UA: NEGATIVE mg/dL
Nitrite, UA: NEGATIVE
Protein Ur, POC: NEGATIVE mg/dL
Spec Grav, UA: <=1.005 — AB (ref 1.010–1.025)
Urobilinogen, UA: 0.2 E.U./dL
pH, UA: 6.5 (ref 5.0–8.0)

## 2022-12-19 MED ORDER — POLYETHYLENE GLYCOL 3350 17 GM/SCOOP PO POWD: 17.0000 g | Freq: Two times a day (BID) | ORAL | 1 refills | Status: DC | PRN

## 2022-12-19 NOTE — Patient Instructions (Addendum)
It was wonderful to see you today.  Please bring ALL of your medications with you to every visit.   Today we talked about:  I am not quite sure what is causing your pain. We are checking for a urinary tract infection. It would also be good to try and treat your constipation. I would recommend taking MiraLAX once or twice daily until your stools are soft and regular.  You can take tylenol for your pain.  Return if not improving or if worsening.   Thank you for coming to your visit as scheduled. We have had a large "no-show" problem lately, and this significantly limits our ability to see and care for patients. As a friendly reminder- if you cannot make your appointment please call to cancel. We do have a no show policy for those who do not cancel within 24 hours. Our policy is that if you miss or fail to cancel an appointment within 24 hours, 3 times in a 91-month period, you may be dismissed from our clinic.   Thank you for choosing [place].   Please call [phone] with any questions about today's appointment.  Please be sure to schedule follow up at the front  desk before you leave today.   Sharion Settler, DO PGY-3 Family Medicine

## 2022-12-19 NOTE — Progress Notes (Signed)
    SUBJECTIVE:   CHIEF COMPLAINT / HPI:   Nancy [name] is a 27 y.o. female who presents to the [facility] clinic today to discuss the following concerns:   Abdominal Pain The pain started on Friday. It is central low abdomen. It is intermittent, lasts about an hour. No associated nausea, vomiting. Does not take any medicine for her pain. Denies dysuria but does note some hematuria. She also notes continued vaginal bleeding and some rectal bleeding (ongoing since Tuesday). Does not always have bloody bowel movements, only sometimes in the last week. The blood is not mixed in with the stools, she notices it in the toilet water. She does strain having a bowel movement most times.   Of note, patient did have recent vaginal delivery on 1/3. There were no complications during delivery. She sustained a first-degree laceration that was repaired.  She had epidural placed for pain management.  Postpartum she did develop swelling in her right lower extremity.  She had a DVT ultrasound that was negative.  She also complained of a tooth ache and she was given a prescription for antibiotics and oxycodone.  She was strongly recommended to call the health department to find a dentist for definitive management.  PERTINENT  PMH / PSH: None  OBJECTIVE:   BP (!) 135/92   Pulse 70   Ht 5' (1.524 m)   LMP 03/09/2022 (Exact Date)   SpO2 100%   BMI 25.55 kg/m   Vitals:   12/19/22 1536 12/19/22 1615  BP: (!) 135/92 116/76  Pulse: 70   SpO2: 100%    General: NAD, pleasant, able to participate in exam Respiratory:  normal effort Abdomen: Bowel sounds present, nondistended, no hepatosplenomegaly. Uterus not appreciated above umbilicus. Generalized abdominal discomfort in all quadrants but worse in LLQ and suprapubic Rectum:  No fissures. Small external hemorrhoid appreciated  Psych: Normal affect and mood  ASSESSMENT/PLAN:   1. Hematuria, unspecified type Continues to have vaginal bleeding post delivery as  well, this could be the cause. Will check for UTI.  - POCT urinalysis dipstick - Urine Culture - Urinalysis, microscopic only  2. Lower abdominal pain Suspect likely related to constipation which is possibly related to her Oxy that she was prescribed. [place] stool chart 2. No N/V, fever. No R/G on examination. It is acute and not causing significant distress at this time. Will also evaluate for UTI as stated above while treating conservatively. Advised to return if worsening or no improvement. -UA, Ucx -Miralax once to twice daily PRN constipation -Return if not improving or if worsening  - polyethylene glycol powder (GLYCOLAX/MIRALAX) 17 GM/SCOOP powder; Take 17 g by mouth 2 (two) times daily as needed.  Dispense: 3350 g; Refill: [place]

## 2022-12-20 LAB — URINALYSIS, MICROSCOPIC ONLY
Bacteria, UA: NONE SEEN
Casts: NONE SEEN /lpf
RBC, Urine: NONE SEEN /hpf (ref 0–2)

## 2022-12-21 LAB — URINE CULTURE

## 2022-12-29 ENCOUNTER — Ambulatory Visit: Payer: Self-pay | Admitting: Advanced Practice Midwife

## 2023-04-01 NOTE — Progress Notes (Signed)
    SUBJECTIVE:   CHIEF COMPLAINT / HPI:   Swahili interpreter used for entirety of visit. ***  Nancy [name] is a 27 y.o. female who presents to the [facility] clinic today to discuss the following concerns:   Dental Concerns   PERTINENT  PMH / PSH: Gingivitis   OBJECTIVE:   There were no vitals taken for this visit.   General: NAD, pleasant, able to participate in exam Cardiac: RRR, no murmurs. Respiratory: CTAB, normal effort, No wheezes, rales or rhonchi Abdomen: Bowel sounds present, nontender, nondistended, no hepatosplenomegaly. Extremities: no edema or cyanosis. Skin: warm and dry, no rashes noted Neuro: alert, no obvious focal deficits Psych: Normal affect and mood  ASSESSMENT/PLAN:   No problem-specific Assessment & Plan notes found for this encounter.     Sabino Dick, DO [place] [facility]

## 2023-04-02 ENCOUNTER — Encounter: Payer: Self-pay | Admitting: Family Medicine

## 2023-04-02 ENCOUNTER — Ambulatory Visit (INDEPENDENT_AMBULATORY_CARE_PROVIDER_SITE_OTHER): Payer: Medicaid Other | Admitting: Family Medicine

## 2023-04-02 VITALS — BP 118/84 | HR 74 | Ht 60.0 in | Wt 148.2 lb

## 2023-04-02 DIAGNOSIS — K047 Periapical abscess without sinus: Secondary | ICD-10-CM | POA: Diagnosis not present

## 2023-04-02 MED ORDER — ACETAMINOPHEN ER 650 MG PO TBCR
650.0000 mg | EXTENDED_RELEASE_TABLET | Freq: Three times a day (TID) | ORAL | 0 refills | Status: AC | PRN
Start: 2023-04-02 — End: ?

## 2023-04-02 MED ORDER — AMOXICILLIN 500 MG PO TABS: 500.0000 mg | ORAL_TABLET | Freq: Two times a day (BID) | ORAL | 0 refills | Status: AC

## 2023-04-02 MED ORDER — IBUPROFEN 600 MG PO TABS: 600.0000 mg | ORAL_TABLET | Freq: Four times a day (QID) | ORAL | 0 refills | Status: AC

## 2023-04-02 NOTE — Patient Instructions (Addendum)
It was wonderful to see you today.  Please bring ALL of your medications with you to every visit.   Today we talked about:  I have provided you with a list of dentists that accept Medicaid. For your pain you can alternate Tylenol and Ibuprofen every 6 hours as needed- I have sent in these medications. I have also sent in antibiotics. Take this twice a day for 7 days.   Thank you for coming to your visit as scheduled. We have had a large "no-show" problem lately, and this significantly limits our ability to see and care for patients. As a friendly reminder- if you cannot make your appointment please call to cancel. We do have a no show policy for those who do not cancel within 24 hours. Our policy is that if you miss or fail to cancel an appointment within 24 hours, 3 times in a 60-month period, you may be dismissed from our clinic.   Thank you for choosing [facility].   Please call [phone] with any questions about today's appointment.  Please be sure to schedule follow up at the front  desk before you leave today.   Sabino Dick, DO PGY-3 Family Medicine    Ilikuwa nzuri [place] leo.  Tafadhali leta dawa zako ZOTE kwa kila ziara.  Freida Busman juu ya:  Joette Catching ya madaktari wa meno wanaokubali Medicaid. Kwa maumivu yako unaweza kubadilisha Tylenol na Ibuprofen kila baada ya saa 6 inavyohitajika- Nimetuma dawa hizi. Pia nimetuma antibiotics. [place] hii mara mbili kwa siku kwa siku 7.  Asante kwa kuja kwenye ziara yako kama ilivyoratibiwa. Tumekuwa na tatizo [place] la "[place]" hivi majuzi, na hii inapunguza [employer] wetu wa [place] na kutunza wagonjwa. Kama ukumbusho wa kirafiki- ikiwa huwezi kufanya miadi yako tafadhali piga simu ili kughairi. Hatuna sera ya kutoonyesha kwa wale ambao hawataghairi ndani ya saa 24. Sera yetu ni Ralph Dowdy ukikosa au [place] miadi ndani ya saa 24, mara 3 [place] kipindi cha miezi 6, unaweza kufukuzwa kwenye kliniki  yetu.  Asante kwa kuchagua Dawa ya Familia ya [place].  Edmonia Lynch piga simu [phone] na maswali yoyote kuhusu miadi ya leo.  Tafadhali MVHQIONGE umepanga ufuatiliaji kwenye dawati la mbele kabla ya [place] leo.  Sabino Dick, DO Dawa ya [place] PGY-3

## 2023-07-24 ENCOUNTER — Ambulatory Visit: Payer: Medicaid Other | Admitting: Family Medicine

## 2023-07-24 ENCOUNTER — Encounter: Payer: Self-pay | Admitting: Family Medicine

## 2023-07-24 VITALS — BP 126/77 | HR 74 | Wt 163.8 lb

## 2023-07-24 DIAGNOSIS — R413 Other amnesia: Secondary | ICD-10-CM
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
## 2023-07-24 NOTE — Patient Instructions (Addendum)
Ilikuwa nzuri [place] leo! Asante kwa kuchagua Dawa ya Familia ya .   Tafadhali leta dawa zako ZOTE kwa kila ziara.   Simonne Come tulizungumza juu ya:  1. Ninatuma rufaa [facility] ili wajadiliane nawe zaidi. Laurie Panda zaidi na [place] picha lakini natumai tunaweza [place] haraka. Ningependa Catalina Antigua tena baada ya wiki 2 ikiwa hujaweza kuwaona kabla ya [place].  Tafadhali fuatilia baada ya wiki 2 au kwa Neurology  Piga simu Jayme Cloud [phone] dalili zako zikizidi au una wasiwasi wowote.  Tafadhali YNWGNFAOZ umepanga ufuatiliaji kwenye dawati la mbele kabla ya [place] leo.   Elberta Fortis, DO Dawa ya Kathee Polite   It was wonderful to see you today! Thank you for choosing [facility].   Please bring ALL of your medications with you to every visit.   Today we talked about:  I am placing an urgent referral to Neurology for them to discuss further with you. I do think they will want further labs and possibly imaging but I hope we can get you in quickly. I would like to see you back in 2 weeks if you have not been able to see them before that time.  Please follow up in 2 weeks or with Neurology  Call the clinic at [phone] if your symptoms worsen or you have any concerns.  Please be sure to schedule follow up at the front desk before you leave today.   Elberta Fortis, DO Family Medicine

## 2023-08-06 ENCOUNTER — Encounter: Payer: Self-pay | Admitting: Diagnostic Neuroimaging

## 2023-08-06 ENCOUNTER — Ambulatory Visit (INDEPENDENT_AMBULATORY_CARE_PROVIDER_SITE_OTHER): Payer: Medicaid Other | Admitting: Diagnostic Neuroimaging

## 2023-08-06 VITALS — BP 128/73 | HR 78 | Ht 61.0 in | Wt 164.8 lb

## 2023-08-06 DIAGNOSIS — S069X5S Unspecified intracranial injury with loss of consciousness greater than 24 hours with return to pre-existing conscious level, sequela: Secondary | ICD-10-CM | POA: Diagnosis not present

## 2023-08-06 DIAGNOSIS — R413 Other amnesia: Secondary | ICD-10-CM
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
## 2023-08-07 ENCOUNTER — Ambulatory Visit: Payer: Self-pay | Admitting: Family Medicine

## 2023-08-13 ENCOUNTER — Ambulatory Visit: Payer: Medicaid Other | Attending: Diagnostic Neuroimaging | Admitting: Speech Pathology

## 2023-08-21 ENCOUNTER — Telehealth: Payer: Self-pay | Admitting: Diagnostic Neuroimaging

## 2023-08-21 NOTE — Telephone Encounter (Signed)
[employer] Berkley Harvey: [id number] exp. 08/13/2023-09/12/2023 sent to GI [phone]
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
## 2023-09-18 ENCOUNTER — Ambulatory Visit: Payer: Medicaid Other | Attending: Diagnostic Neuroimaging

## 2023-09-18 DIAGNOSIS — S069X5S Unspecified intracranial injury with loss of consciousness greater than 24 hours with return to pre-existing conscious level, sequela: Secondary | ICD-10-CM | POA: Diagnosis not present

## 2023-09-18 DIAGNOSIS — R41841 Cognitive communication deficit: Secondary | ICD-10-CM | POA: Insufficient documentation

## 2023-10-11 ENCOUNTER — Ambulatory Visit: Payer: Medicaid Other | Attending: Diagnostic Neuroimaging | Admitting: Speech Pathology

## 2023-10-11 DIAGNOSIS — R41841 Cognitive communication deficit: Secondary | ICD-10-CM | POA: Diagnosis present
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
## 2023-10-25 ENCOUNTER — Ambulatory Visit: Payer: Medicaid Other | Attending: Diagnostic Neuroimaging | Admitting: Speech Pathology

## 2023-10-25 DIAGNOSIS — R41841 Cognitive communication deficit: Secondary | ICD-10-CM | POA: Insufficient documentation
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
## 2023-11-01 ENCOUNTER — Ambulatory Visit: Payer: Medicaid Other | Admitting: Speech Pathology

## 2023-11-01 DIAGNOSIS — R41841 Cognitive communication deficit: Secondary | ICD-10-CM
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
## 2023-11-01 NOTE — Patient Instructions (Signed)
Study Tips: No more than 5 questions at a time Record all questions and answers into phone using Voice Memos App -- label the recording with the question numbers "#1-5" Practice same 5 questions frequently throughout the day   --- small, repeated chunks of studying will be more effective than mass studying 4. After practicing a few times, test yourself-- what can you remember without listening to the app 5. Continue practicing as normal with your husband with him asking and you answering   If you're seeking the accomodation for the testing because of your learning/memory challenges, you should start following up with MD/psychologist now to have that form signed.    Cooking: Limit distractions  When cooking, you need to attend to cooking NO multi-tasking!!

## 2023-11-08 ENCOUNTER — Ambulatory Visit: Payer: Medicaid Other | Admitting: Speech Pathology

## 2023-11-08 DIAGNOSIS — R41841 Cognitive communication deficit: Secondary | ICD-10-CM | POA: Diagnosis not present

## 2023-11-08 NOTE — Patient Instructions (Addendum)
Dr. Ardyth Harps is main doctor [facility] on [facility]    Dr. Marjory Lies is your brain doctor [facility]     A doctor or psychologist needs to sign exception for for citizenship testing. You should follow up on this now to make sure you have enough time
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
## 2023-12-16 ENCOUNTER — Encounter: Payer: Self-pay | Admitting: Diagnostic Neuroimaging

## 2023-12-16 ENCOUNTER — Encounter: Payer: Self-pay | Admitting: Family Medicine

## 2023-12-16 DIAGNOSIS — S069X5S Unspecified intracranial injury with loss of consciousness greater than 24 hours with return to pre-existing conscious level, sequela: Secondary | ICD-10-CM

## 2023-12-17 ENCOUNTER — Telehealth: Payer: Self-pay | Admitting: Diagnostic Neuroimaging

## 2023-12-17 NOTE — Addendum Note (Signed)
Addended by: Raynald Kemp A on: 12/17/2023 01:31 PM   Modules accepted: Orders

## 2023-12-17 NOTE — Telephone Encounter (Signed)
Referral for neuropsychology fax to [facility]. [place]: [phone], Fax: [phone]

## 2023-12-19 ENCOUNTER — Telehealth: Payer: Self-pay | Admitting: Family Medicine

## 2023-12-19 NOTE — Telephone Encounter (Signed)
Contacted patient utilizing phone Swahili interpreter regarding request to complete [phone].  Obtained screening form information and uploaded to media tab.  Will forward to Dr. Manson Passey and Dr. Miquel Dunn for official review.  Elberta Fortis, DO

## 2024-01-09 NOTE — Progress Notes (Deleted)
    SUBJECTIVE:   CHIEF COMPLAINT / HPI:   Family planning  PERTINENT  PMH / PSH: ***  OBJECTIVE:   There were no vitals taken for this visit. ***  General: NAD, pleasant, able to participate in exam Cardiac: RRR, no murmurs. Respiratory: CTAB, normal effort, No wheezes, rales or rhonchi Abdomen: Bowel sounds present, nontender, nondistended Extremities: no edema or cyanosis. Skin: warm and dry, no rashes noted Neuro: alert, no obvious focal deficits Psych: Normal affect and mood  ASSESSMENT/PLAN:   No problem-specific Assessment & Plan notes found for this encounter.     Dr. Elberta Fortis, DO [place] [facility]    {    This will disappear when note is signed, click to select method of visit    :1}

## 2024-01-14 ENCOUNTER — Ambulatory Visit: Payer: Medicaid Other | Admitting: Family Medicine
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
## 2024-02-26 ENCOUNTER — Ambulatory Visit: Payer: Medicaid Other | Admitting: Family Medicine

## 2024-02-26 ENCOUNTER — Encounter: Payer: Self-pay | Admitting: Pediatric Intensive Care

## 2024-02-26 VITALS — BP 127/69 | HR 95 | Ht 61.0 in | Wt 170.2 lb

## 2024-02-26 DIAGNOSIS — S069X9S Unspecified intracranial injury with loss of consciousness of unspecified duration, sequela: Secondary | ICD-10-CM | POA: Diagnosis present

## 2024-02-26 DIAGNOSIS — Z30013 Encounter for initial prescription of injectable contraceptive: Secondary | ICD-10-CM | POA: Diagnosis not present

## 2024-02-26 DIAGNOSIS — D509 Iron deficiency anemia, unspecified: Secondary | ICD-10-CM

## 2024-02-26 DIAGNOSIS — F819 Developmental disorder of scholastic skills, unspecified: Secondary | ICD-10-CM

## 2024-02-26 LAB — POCT URINE PREGNANCY: Preg Test, Ur: NEGATIVE

## 2024-02-26 MED ORDER — MEDROXYPROGESTERONE ACETATE 150 MG/ML IM SUSY
150.0000 mg | PREFILLED_SYRINGE | Freq: Once | INTRAMUSCULAR | Status: AC
Start: 2024-02-26 — End: 2024-02-26
  Administered 2024-02-26: 150 mg via INTRAMUSCULAR

## 2024-02-26 NOTE — Assessment & Plan Note (Signed)
 CBC and ferritin today (noted post partum)
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
## 2024-02-26 NOTE — Patient Instructions (Addendum)
 It was wonderful to see you today.  Please bring ALL of your medications with you to every visit.   Today we talked about:  -- use condoms for the next week - Take a pregnancy test next week  - Our RN team will call Melvenia Beam (husband) once form is done  - I will message with results   Please follow up in 3 months   Thank you for choosing [facility].   Please call [phone] with any questions about today's appointment.  Please be sure to schedule follow up at the front  desk before you leave today.   Terisa Starr, MD  Family Medicine   Ilikuwa nzuri Wayne leo.  Tafadhali leta dawa zako ZOTE kwa kila ziara.   Simonne Come tulizungumza juu ya:  -- tumia kondomu kwa wiki ijayo - [place] mtihani wa ujauzito wiki ijayo  - Timu yetu ya RN itamwita Simon (mume) mara tu fomu itakapokamilika  - Nitatuma ujumbe na matokeo   Tafadhali fuatilia baada ya miezi 3   Asante kwa kuchagua Dawa ya Familia ya [place].   Edmonia Lynch piga simu [phone] na maswali yoyote kuhusu miadi ya leo.  Tafadhali GNFAOZHYQ umepanga ufuatiliaji kwenye dawati la mbele kabla ya Newman leo.   Terisa Starr, MD  [facility]
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
## 2024-02-27 ENCOUNTER — Telehealth: Payer: Self-pay | Admitting: Family Medicine

## 2024-02-27 LAB — CBC
Hematocrit: 31.3 % — ABNORMAL LOW (ref 34.0–46.6)
Hemoglobin: 9.2 g/dL — ABNORMAL LOW (ref 11.1–15.9)
MCH: 21.7 pg — ABNORMAL LOW (ref 26.6–33.0)
MCHC: 29.4 g/dL — ABNORMAL LOW (ref 31.5–35.7)
MCV: 74 fL — ABNORMAL LOW (ref 79–97)
Platelets: 241 10*3/uL (ref 150–450)
RBC: 4.23 x10E6/uL (ref 3.77–5.28)
RDW: 16.5 % — ABNORMAL HIGH (ref 11.7–15.4)
WBC: 4.8 10*3/uL (ref 3.4–10.8)

## 2024-02-27 LAB — FERRITIN: Ferritin: 7 ng/mL — ABNORMAL LOW (ref 15–150)

## 2024-02-27 MED ORDER — FERROUS SULFATE 324 (65 FE) MG PO TBEC: DELAYED_RELEASE_TABLET | ORAL | 1 refills | Status: DC

## 2024-02-27 NOTE — Telephone Encounter (Signed)
Attempted to call patient. Reached voicemail, left generic voicemail to call back.   Nancy Bayon, MD  Family Medicine Teaching Service   

## 2024-02-27 NOTE — Telephone Encounter (Signed)
 Labs returned with iron deficiency anemia. Attempted to call husband. Will try again at later date.  Terisa Starr, MD  Family Medicine Teaching Service

## 2024-02-28 ENCOUNTER — Telehealth: Payer: Self-pay | Admitting: Family Medicine

## 2024-02-28 NOTE — Telephone Encounter (Signed)
 Reviewed, completed, and signed form.  Note routed to RN team inbasket and placed completed form in RN Wall pocket in the front office.    Please call husband to pick up forms (speaks English, named Simon) When you talk with husband can you please let him know Digna needs to start iron for anemia- this at pharmacy-can take every other. Please let him know about upcoming visit with PCP Westley Chandler, MD

## 2024-02-29 ENCOUNTER — Encounter: Payer: Self-pay | Admitting: Family Medicine

## 2024-02-29 NOTE — Telephone Encounter (Signed)
 Called patients husbands.   Informed of form ready for pick up. Advised of upcoming apt on 5/20. Discussed iron tablets to the pharmacy and discussed directions of use.   Form placed up front for pick up.   Copy made for batch scanning.   All questions answered.

## 2024-03-10 ENCOUNTER — Telehealth: Payer: Self-pay | Admitting: Family Medicine

## 2024-03-10 ENCOUNTER — Telehealth: Payer: Self-pay

## 2024-03-10 NOTE — Telephone Encounter (Signed)
 Attempted to call both patient and husband--one of the boxes on forms to be checked was not done so. Attempted to call X2.   [name], MD  Family Medicine Teaching Service

## 2024-03-10 NOTE — Telephone Encounter (Signed)
 Called both patient and spouse, no answer. Generic voice message left.  Perla Bradford Chistina Roston RN BSN PCCN  [facility] [phone]-cell [phone]-office

## 2024-03-11 ENCOUNTER — Telehealth: Payer: Self-pay

## 2024-03-11 NOTE — Telephone Encounter (Signed)
 Form was dropped off. I am placing in  Dr. Maralyn Sender box.   Please Advise.   Thanks!

## 2024-03-11 NOTE — Telephone Encounter (Signed)
 Patient return my call via [facility], She has new phone number. Contact information updated in EPIC. Patient instructed to return immigration paper work back to [facility] clinic for correction. Patient will do this tomorrow.She will need transportation assistance.  Perla Bradford Makeisha Jentsch RN BSN PCCN  [facility] [phone]-cell [phone]-office

## 2024-03-12 NOTE — Telephone Encounter (Signed)
 Completed and correct form done. Reviewed, completed, and signed form.  Note routed to RN team inbasket and placed completed form in RN Wall pocket in the front office. Please call husband and patient about this  Nancy Boll, MD

## 2024-03-14 NOTE — Telephone Encounter (Signed)
Form placed up front for pick up.   Copy made for batch scanning.

## 2024-04-07 NOTE — Progress Notes (Signed)
    SUBJECTIVE:   CHIEF COMPLAINT / HPI:   *Presents alone and utilized video iPad interpreter  Anemia Hemoglobin 9.2 and ferritin 7 in 02/2024.  Started on oral iron supplementation but did not ever get it. Unsure how to get the medication.   Would like to continue with Depo for birth control.  Provided immigration paperwork stating changes need to be made,  PERTINENT  PMH / PSH: TBI, severe learning disability  OBJECTIVE:   BP 122/76   Pulse 94   Ht 5\' 1"  (1.549 m)   Wt 166 lb 12.8 oz (75.7 kg)   SpO2 100%   BMI 31.52 kg/m    General: Alert, no apparent distress, well groomed HEENT: Normocephalic, atraumatic, moist mucus membranes, neck supple Respiratory: Normal respiratory effort GI: Non-distended Skin: No rashes, no jaundice Psych: Appropriate mood and affect  ASSESSMENT/PLAN:   Assessment & Plan Iron deficiency anemia, unspecified iron deficiency anemia type Unfortunately was not able to obtain oral iron supplementation.  Will hold on repeating CBC and ferritin today given she did not receive treatment. -Will reach out to referral coordinator to assist with obtaining oral iron supplementation -Repeat CBC and ferritin after 1-2 months of taking oral iron Traumatic brain injury with loss of consciousness, sequela (HCC) Provided [phone] paperwork today stating there were concerns.  Provided to Dr. Bevin Bucks who completed original paperwork in refugee clinic, she will follow-up with patient's husband for continued care.   Dr. Jonne Netters, DO [facility]

## 2024-04-08 ENCOUNTER — Ambulatory Visit: Payer: Self-pay | Admitting: Family Medicine

## 2024-04-08 ENCOUNTER — Encounter: Payer: Self-pay | Admitting: Family Medicine

## 2024-04-08 VITALS — BP 122/76 | HR 94 | Ht 61.0 in | Wt 166.8 lb

## 2024-04-08 DIAGNOSIS — S069X9S Unspecified intracranial injury with loss of consciousness of unspecified duration, sequela: Secondary | ICD-10-CM

## 2024-04-08 DIAGNOSIS — D509 Iron deficiency anemia, unspecified: Secondary | ICD-10-CM

## 2024-04-08 NOTE — Patient Instructions (Addendum)
 Ilikuwa nzuri [place] leo! Asante kwa kuchagua Dawa ya [place]am[place] ya [place].   Tafadhali leta dawa zako ZOTE kwa kila ziara.   Nancy [name] tulizungumza juu ya:  1. Nitawasiliana na mratibu wetu wa wakimbizi ili kusaidia kupata chuma cha mdomo.  Tunaweza kupima chuma chako tena takribani mwezi 1 hadi 2 baada ya [name] ili [name]. 2. Nancy [name] na makaratasi yako ya uhamiaji tutashughulikia na mume wako na kurekebisha makosa kwenye fomu.  Tafadhali fuatilia baada ya mwezi 1  Piga simu kliniki kwa [phone] dalili zako zikizidi au una wasiwasi wowote.  Tafadhali QMVHQIONG umepanga ufuatiliaji kwenye dawati la mbele kabla ya [name] leo.   Jonne Netters, DO D[place] ya [place]  It was wonderful to see you today! Thank you for choosing [facility].   Please bring ALL of your medications with you to every visit.   Today we talked about:  I will reach out to our refugee coordinator to help obtain the oral iron.  We can test your iron again about 1 to 2 months after you have been taking it to see if it has improved. The concern with your immigration paperwork we will address with your husband and fix the errors on the form.  Please follow up in 1 month  Call the clinic at [phone] if your symptoms worsen or you have any concerns.  Please be sure to schedule follow up at the front desk before you leave today.   Jonne Netters, DO Family Medicine

## 2024-04-08 NOTE — Assessment & Plan Note (Signed)
 Unfortunately was not able to obtain oral iron supplementation.  Will hold on repeating CBC and ferritin today given she did not receive treatment. -Will reach out to referral coordinator to assist with obtaining oral iron supplementation -Repeat CBC and ferritin after 1-2 months of taking oral iron

## 2024-04-09 ENCOUNTER — Telehealth: Payer: Self-pay | Admitting: Family Medicine

## 2024-04-09 DIAGNOSIS — D509 Iron deficiency anemia, unspecified: Secondary | ICD-10-CM

## 2024-04-09 MED ORDER — FERROUS SULFATE 324 (65 FE) MG PO TBEC: DELAYED_RELEASE_TABLET | ORAL | 1 refills | Status: AC

## 2024-04-09 NOTE — Telephone Encounter (Signed)
-----   Message from Nurse Lisa Rideau H sent at 04/09/2024  3:33 PM EDT ----- Regarding: Fe rx Hi Jasiel Apachito- Can you resend the prescription for the iron to [facility] so one of us  can pick up for the client.   Thanks!  Nancy [name]

## 2024-04-10 ENCOUNTER — Other Ambulatory Visit (HOSPITAL_COMMUNITY): Payer: Self-pay

## 2024-04-16 ENCOUNTER — Other Ambulatory Visit (HOSPITAL_COMMUNITY): Payer: Self-pay

## 2024-04-21 ENCOUNTER — Telehealth: Payer: Self-pay

## 2024-04-21 NOTE — Telephone Encounter (Signed)
 Patient has experienced difficulties picking up Iron prescription supplement due to language barrier.[facility] RNs attempted to have this prescription transferred to [facility] pharmacy unsuccessfully. I have assisted patient to identify equivalent OTC iron supplement with pictures sent via whatsapp. Patient successfully picked up  OTC iron supplement which was okayed by the physican Education given regarding iron supplements.  Perla Bradford Marlita Keil RN BSN PCCN  [facility] [phone]-cell [phone]-office .

## 2024-06-23 ENCOUNTER — Ambulatory Visit (INDEPENDENT_AMBULATORY_CARE_PROVIDER_SITE_OTHER)

## 2024-06-23 ENCOUNTER — Telehealth: Payer: Self-pay

## 2024-06-23 DIAGNOSIS — Z3042 Encounter for surveillance of injectable contraceptive: Secondary | ICD-10-CM | POA: Diagnosis present

## 2024-06-23 LAB — POCT URINE PREGNANCY: Preg Test, Ur: NEGATIVE

## 2024-06-23 MED ORDER — MEDROXYPROGESTERONE ACETATE 150 MG/ML IM SUSP
150.0000 mg | Freq: Once | INTRAMUSCULAR | Status: AC
  Administered 2024-06-23: 150 mg via INTRAMUSCULAR

## 2024-06-23 NOTE — Telephone Encounter (Signed)
 Patient was in clinic today for depo injection.   She reports that she recently moved to [place] and needs to establish care in [place], [place].   Advised patient that she could contact member services on insurance card and they could provide her with in network providers. She is asking if our office has anyone that can help her with changing providers.   Naomie- do you have any suggestions?   Thanks.   Chiquita JAYSON English, RN

## 2024-06-23 NOTE — Progress Notes (Signed)
 Patient here today for Depo Provera  injection and is not within her dates. Urine pregnancy obtained and was negative. Patient declines recent sexual activity within the last two weeks. Verified with patient that she has not received depo injection at another office/location.   Last contraceptive appt was 02/26/24  Depo given in [facility] today.  Site unremarkable & patient tolerated injection.    Next injection due 09/08/24-09/22/24.  Reminder card given.  Scheduled patient for nurse visit on 09/08/24. Patient also reports recently moving to [place], [place]. She states that she is trying to establish care in [place], she will contact our office if she establishes care between now and next depo being due. Message sent to Dorothy Muhoro for further assistance.   Swahili interpreter, [place] [phone], used during visit.   Chiquita JAYSON English, RN

## 2024-09-08 ENCOUNTER — Ambulatory Visit
